# Patient Record
Sex: Male | Born: 1970 | Race: White | Hispanic: No | Marital: Married | Smoking: Current every day smoker
Health system: Southern US, Community
[De-identification: ages and names within clinical notes are randomized; demographics above are authoritative.]

## PROBLEM LIST (undated history)

## (undated) DIAGNOSIS — K635 Polyp of colon: Secondary | ICD-10-CM

## (undated) DIAGNOSIS — D759 Disease of blood and blood-forming organs, unspecified: Secondary | ICD-10-CM

## (undated) DIAGNOSIS — K219 Gastro-esophageal reflux disease without esophagitis: Secondary | ICD-10-CM

## (undated) DIAGNOSIS — D649 Anemia, unspecified: Secondary | ICD-10-CM

## (undated) DIAGNOSIS — R569 Unspecified convulsions: Secondary | ICD-10-CM

## (undated) DIAGNOSIS — B019 Varicella without complication: Secondary | ICD-10-CM

## (undated) DIAGNOSIS — T7840XA Allergy, unspecified, initial encounter: Secondary | ICD-10-CM

## (undated) DIAGNOSIS — D580 Hereditary spherocytosis: Secondary | ICD-10-CM

## (undated) HISTORY — DX: Allergy, unspecified, initial encounter: T78.40XA

## (undated) HISTORY — DX: Anemia, unspecified: D64.9

## (undated) HISTORY — PX: WISDOM TOOTH EXTRACTION: SHX21

## (undated) HISTORY — DX: Polyp of colon: K63.5

## (undated) HISTORY — DX: Varicella without complication: B01.9

## (undated) HISTORY — DX: Gastro-esophageal reflux disease without esophagitis: K21.9

---
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
## 1994-11-09 HISTORY — PX: PILONIDAL CYST EXCISION: SHX744

## 2008-11-23 ENCOUNTER — Ambulatory Visit: Payer: Self-pay

## 2010-11-12 ENCOUNTER — Other Ambulatory Visit: Payer: Self-pay

## 2011-01-02 ENCOUNTER — Emergency Department: Payer: Self-pay | Admitting: Internal Medicine

## 2011-01-26 ENCOUNTER — Other Ambulatory Visit: Payer: Self-pay | Admitting: Physician Assistant

## 2013-06-05 ENCOUNTER — Ambulatory Visit: Payer: Self-pay | Admitting: Orthopedic Surgery

## 2013-07-31 ENCOUNTER — Encounter: Payer: Self-pay | Admitting: Neurosurgery

## 2013-08-09 ENCOUNTER — Encounter: Payer: Self-pay | Admitting: Neurosurgery

## 2013-09-09 ENCOUNTER — Encounter: Payer: Self-pay | Admitting: Neurosurgery

## 2014-02-13 ENCOUNTER — Ambulatory Visit: Payer: Self-pay | Admitting: Oncology

## 2014-02-13 LAB — FOLATE: FOLIC ACID: 16.6 ng/mL (ref 3.1–100.0)

## 2014-02-13 LAB — CBC CANCER CENTER
Basophil #: 0 x10 3/mm (ref 0.0–0.1)
Basophil %: 0.4 %
Eosinophil #: 0.3 x10 3/mm (ref 0.0–0.7)
Eosinophil %: 4.1 %
HCT: 33.8 % — ABNORMAL LOW (ref 40.0–52.0)
HGB: 11.6 g/dL — ABNORMAL LOW (ref 13.0–18.0)
Lymphocyte #: 2 x10 3/mm (ref 1.0–3.6)
Lymphocyte %: 27.6 %
MCH: 32 pg (ref 26.0–34.0)
MCHC: 34.4 g/dL (ref 32.0–36.0)
MCV: 93 fL (ref 80–100)
Monocyte #: 0.5 x10 3/mm (ref 0.2–1.0)
Monocyte %: 7.5 %
Neutrophil #: 4.4 x10 3/mm (ref 1.4–6.5)
Neutrophil %: 60.4 %
Platelet: 122 x10 3/mm — ABNORMAL LOW (ref 150–440)
RBC: 3.63 10*6/uL — ABNORMAL LOW (ref 4.40–5.90)
RDW: 19 % — AB (ref 11.5–14.5)
WBC: 7.3 x10 3/mm (ref 3.8–10.6)

## 2014-02-13 LAB — IRON AND TIBC
IRON SATURATION: 44 %
Iron Bind.Cap.(Total): 251 ug/dL (ref 250–450)
Iron: 111 ug/dL (ref 65–175)
Unbound Iron-Bind.Cap.: 140 ug/dL

## 2014-02-13 LAB — LACTATE DEHYDROGENASE: LDH: 184 U/L (ref 85–241)

## 2014-02-13 LAB — FERRITIN: FERRITIN (ARMC): 217 ng/mL (ref 8–388)

## 2014-03-09 ENCOUNTER — Ambulatory Visit: Payer: Self-pay | Admitting: Oncology

## 2014-05-15 ENCOUNTER — Ambulatory Visit: Payer: Self-pay | Admitting: Oncology

## 2014-05-15 LAB — CBC CANCER CENTER
BASOS ABS: 0 x10 3/mm (ref 0.0–0.1)
Basophil %: 0.4 %
EOS ABS: 0.2 x10 3/mm (ref 0.0–0.7)
Eosinophil %: 3 %
HCT: 35.2 % — ABNORMAL LOW (ref 40.0–52.0)
HGB: 12.7 g/dL — AB (ref 13.0–18.0)
Lymphocyte #: 2.4 x10 3/mm (ref 1.0–3.6)
Lymphocyte %: 30 %
MCH: 33.3 pg (ref 26.0–34.0)
MCHC: 36.1 g/dL — ABNORMAL HIGH (ref 32.0–36.0)
MCV: 92 fL (ref 80–100)
MONOS PCT: 8.1 %
Monocyte #: 0.7 x10 3/mm (ref 0.2–1.0)
Neutrophil #: 4.7 x10 3/mm (ref 1.4–6.5)
Neutrophil %: 58.5 %
PLATELETS: 123 x10 3/mm — AB (ref 150–440)
RBC: 3.82 10*6/uL — ABNORMAL LOW (ref 4.40–5.90)
RDW: 18 % — AB (ref 11.5–14.5)
WBC: 8.1 x10 3/mm (ref 3.8–10.6)

## 2014-06-09 ENCOUNTER — Ambulatory Visit: Payer: Self-pay | Admitting: Oncology

## 2014-11-06 ENCOUNTER — Ambulatory Visit: Payer: Self-pay | Admitting: Oncology

## 2014-11-06 LAB — CBC CANCER CENTER
BASOS ABS: 0 x10 3/mm (ref 0.0–0.1)
BASOS PCT: 0.2 %
EOS ABS: 0.2 x10 3/mm (ref 0.0–0.7)
EOS PCT: 2.3 %
HCT: 35.5 % — ABNORMAL LOW (ref 40.0–52.0)
HGB: 12.7 g/dL — ABNORMAL LOW (ref 13.0–18.0)
LYMPHS ABS: 2 x10 3/mm (ref 1.0–3.6)
Lymphocyte %: 25.3 %
MCH: 32.1 pg (ref 26.0–34.0)
MCHC: 35.6 g/dL (ref 32.0–36.0)
MCV: 90 fL (ref 80–100)
MONO ABS: 0.6 x10 3/mm (ref 0.2–1.0)
Monocyte %: 7.9 %
NEUTROS ABS: 5.2 x10 3/mm (ref 1.4–6.5)
NEUTROS PCT: 64.3 %
Platelet: 118 x10 3/mm — ABNORMAL LOW (ref 150–440)
RBC: 3.95 10*6/uL — ABNORMAL LOW (ref 4.40–5.90)
RDW: 17.5 % — ABNORMAL HIGH (ref 11.5–14.5)
WBC: 8 x10 3/mm (ref 3.8–10.6)

## 2014-11-09 ENCOUNTER — Ambulatory Visit: Payer: Self-pay | Admitting: Oncology

## 2015-02-05 DIAGNOSIS — D693 Immune thrombocytopenic purpura: Secondary | ICD-10-CM | POA: Insufficient documentation

## 2015-02-05 DIAGNOSIS — D649 Anemia, unspecified: Secondary | ICD-10-CM | POA: Insufficient documentation

## 2015-02-05 DIAGNOSIS — G40309 Generalized idiopathic epilepsy and epileptic syndromes, not intractable, without status epilepticus: Secondary | ICD-10-CM | POA: Insufficient documentation

## 2015-09-11 ENCOUNTER — Ambulatory Visit
Admission: RE | Admit: 2015-09-11 | Discharge: 2015-09-11 | Disposition: A | Discharge status: Home / Self Care | Payer: 59 | Source: Ambulatory Visit | Attending: Family | Admitting: Family

## 2015-09-11 ENCOUNTER — Ambulatory Visit: Payer: Self-pay | Admitting: Family

## 2015-09-11 ENCOUNTER — Encounter: Payer: Self-pay | Admitting: Family

## 2015-09-11 VITALS — BP 110/80 | HR 80 | Temp 98.6°F

## 2015-09-11 DIAGNOSIS — J209 Acute bronchitis, unspecified: Secondary | ICD-10-CM

## 2015-09-11 DIAGNOSIS — R059 Cough, unspecified: Secondary | ICD-10-CM

## 2015-09-11 DIAGNOSIS — R05 Cough: Secondary | ICD-10-CM | POA: Insufficient documentation

## 2015-09-11 DIAGNOSIS — Z87898 Personal history of other specified conditions: Secondary | ICD-10-CM | POA: Insufficient documentation

## 2015-09-11 DIAGNOSIS — J0110 Acute frontal sinusitis, unspecified: Secondary | ICD-10-CM

## 2015-09-11 DIAGNOSIS — F17200 Nicotine dependence, unspecified, uncomplicated: Secondary | ICD-10-CM | POA: Diagnosis not present

## 2015-09-11 MED ORDER — ALBUTEROL SULFATE HFA 108 (90 BASE) MCG/ACT IN AERS: 2.0000 | INHALATION_SPRAY | Freq: Four times a day (QID) | RESPIRATORY_TRACT | Status: DC | PRN

## 2015-09-11 MED ORDER — HYDROCOD POLST-CPM POLST ER 10-8 MG/5ML PO SUER: 5.0000 mL | Freq: Two times a day (BID) | ORAL | Status: DC | PRN

## 2015-09-11 MED ORDER — LEVOFLOXACIN 500 MG PO TABS: 500.0000 mg | ORAL_TABLET | Freq: Every day | ORAL | Status: DC

## 2015-09-11 NOTE — Progress Notes (Signed)
S smoker  With cough x one week " hear myself rattling " and wheezing , chest sore from coughing, nasal congestion, HA and PND  O/ VSS mildly ill, NAD  ENT + frontal sinus tenderness, nasal turbinates boggy and inflammed Pharynx increased pnd neck supple , heart rsr lungs with crackling rales left posterior not clearing with cough , scattered wheezes   1. Cough Rx written for tussionex 1 tsp q 12 hours prn #100 cc o rf - DG Chest 2 View  2. Bronchitis with bronchospasm  - DG Chest 2 View - albuterol (PROVENTIL HFA;VENTOLIN HFA) 108 (90 BASE) MCG/ACT inhaler; Inhale 2 puffs into the lungs every 6 (six) hours as needed for wheezing or shortness of breath.  Dispense: 1 Inhaler; Refill: 0  3. Acute frontal sinusitis, recurrence not specified  - levofloxacin (LEVAQUIN) 500 MG tablet; Take 1 tablet (500 mg total) by mouth daily.  Dispense: 10 tablet; Refill: 0  Instructions and cautions for meds. Supportive measures. Encouraged smoking cessation, he is working on it.. F/u prn not improving .

## 2015-09-12 ENCOUNTER — Ambulatory Visit: Payer: Self-pay | Admitting: Physician Assistant

## 2016-01-30 DIAGNOSIS — Z79899 Other long term (current) drug therapy: Secondary | ICD-10-CM | POA: Diagnosis not present

## 2016-01-30 DIAGNOSIS — Z0000 Encounter for general adult medical examination without abnormal findings: Secondary | ICD-10-CM | POA: Diagnosis not present

## 2016-01-30 DIAGNOSIS — G40309 Generalized idiopathic epilepsy and epileptic syndromes, not intractable, without status epilepticus: Secondary | ICD-10-CM | POA: Diagnosis not present

## 2016-01-30 DIAGNOSIS — D693 Immune thrombocytopenic purpura: Secondary | ICD-10-CM | POA: Diagnosis not present

## 2016-02-06 DIAGNOSIS — Z0000 Encounter for general adult medical examination without abnormal findings: Secondary | ICD-10-CM | POA: Diagnosis not present

## 2016-02-06 DIAGNOSIS — G40309 Generalized idiopathic epilepsy and epileptic syndromes, not intractable, without status epilepticus: Secondary | ICD-10-CM | POA: Diagnosis not present

## 2016-02-06 DIAGNOSIS — D693 Immune thrombocytopenic purpura: Secondary | ICD-10-CM | POA: Diagnosis not present

## 2016-02-06 DIAGNOSIS — R0683 Snoring: Secondary | ICD-10-CM | POA: Diagnosis not present

## 2016-04-09 ENCOUNTER — Ambulatory Visit: Payer: Self-pay | Admitting: Physician Assistant

## 2016-04-09 ENCOUNTER — Encounter: Payer: Self-pay | Admitting: Physician Assistant

## 2016-04-09 VITALS — BP 102/76 | HR 74 | Temp 98.6°F

## 2016-04-09 DIAGNOSIS — B349 Viral infection, unspecified: Secondary | ICD-10-CM

## 2016-04-09 NOTE — Progress Notes (Signed)
S:  C/o left eye being irritated and little red, clears with visine, no matting or drainage, sx started few days ago, has hx of seasonal allergies,   denies, cough, congestion, fever, chills, v/d; remainder ros neg, states he did wake up with a sore throat and swollen glands, having a little nausea also but no v/d  O: vitals wnl, nad, perrl eomi, left eye with injected sclera, no drainage or matting noted at this time, tms clear, nasal mucosa inflamed, throat wnl, neck supple no lymph, lungs c t a, cv rrr, abd soft nontender bs normal  A:  Acute viral illness  P: visine opth gtts, return if not better in 3 - 5d, if worsening return earlier or see eye doctor, if starts to drain or matt will call in an eye drop

## 2016-04-15 ENCOUNTER — Ambulatory Visit: Payer: Self-pay | Admitting: Physician Assistant

## 2016-04-15 ENCOUNTER — Encounter: Payer: Self-pay | Admitting: Physician Assistant

## 2016-04-15 VITALS — BP 100/60 | HR 80 | Temp 100.0°F

## 2016-04-15 DIAGNOSIS — J020 Streptococcal pharyngitis: Secondary | ICD-10-CM

## 2016-04-15 LAB — POCT RAPID STREP A (OFFICE): Rapid Strep A Screen: POSITIVE — AB

## 2016-04-15 MED ORDER — DOXYCYCLINE HYCLATE 100 MG PO TABS: 100.0000 mg | ORAL_TABLET | Freq: Two times a day (BID) | ORAL | Status: DC

## 2016-04-15 NOTE — Progress Notes (Signed)
S: c/o fever/chills, sore throat, cough, fever on SAturday and again today, was in the OR and had them take his temp, was 100.2 tympanic, doesn't remember a tick bite but has been working in the yard a lot, no mucus production, no v/d  O: Vitals wnl, nad, tms clear, throat a little injected but mainly normal, neck supple no lymph, lungs c t a, cv rrr, q strep very faint positive  A: strep throat  P: doxy 100mg  bid, f/u prn, no work until has not had a fever for 24-48 hours

## 2016-10-07 ENCOUNTER — Ambulatory Visit (INDEPENDENT_AMBULATORY_CARE_PROVIDER_SITE_OTHER): Payer: 59 | Admitting: Surgery

## 2016-10-07 ENCOUNTER — Other Ambulatory Visit: Payer: Self-pay

## 2016-10-07 ENCOUNTER — Encounter: Payer: Self-pay | Admitting: Surgery

## 2016-10-07 VITALS — BP 127/84 | HR 72 | Temp 98.8°F | Wt 234.0 lb

## 2016-10-07 DIAGNOSIS — K603 Anal fistula: Secondary | ICD-10-CM | POA: Diagnosis not present

## 2016-10-07 MED ORDER — CIPROFLOXACIN HCL 500 MG PO TABS: 500.0000 mg | ORAL_TABLET | Freq: Two times a day (BID) | ORAL | 0 refills | Status: DC

## 2016-10-07 MED ORDER — METRONIDAZOLE 500 MG PO TABS: 500.0000 mg | ORAL_TABLET | Freq: Three times a day (TID) | ORAL | 0 refills | Status: AC

## 2016-10-07 NOTE — Progress Notes (Signed)
  Surgical Consultation  10/07/2016  Colton Perez is an 45 y.o. male.   CC:"rectal fistula "  HPI: This a patient who is had this happen before where he had some drainage from an area near his anus. He states he saw Dr. Pat Patrick in the past and had a diagnosed as a fistula but never had surgery for he's never had a perirectal abscess. He states that he has increased pain and clear drainage. No pus no fevers or chills  No past medical history. She works in the operating room.  No past medical history on file.  No past surgical history on file.  No family history on file.  Social History:  reports that he has been smoking.  He does not have any smokeless tobacco history on file. His alcohol and drug histories are not on file.  Allergies:  Allergies  Allergen Reactions  . Asa [Aspirin]     Medications reviewed.   Review of Systems:   Review of Systems  Constitutional: Negative.   Eyes: Negative.   Gastrointestinal: Negative.   Skin: Negative.      Physical Exam:  There were no vitals taken for this visit.  Physical Exam  Constitutional: He is well-developed, well-nourished, and in no distress. No distress.  HENT:  Head: Normocephalic and atraumatic.  Genitourinary:  Genitourinary Comments: Perirectal area demonstrates a left sided non-erythematous nonfluctuant draining site measuring approximately 2 x 2 mm with no expressible purulence and minimal if any tenderness This all suggestive of fistula  Skin: He is not diaphoretic.  Vitals reviewed.     No results found for this or any previous visit (from the past 48 hour(s)). No results found.  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 He's never had surgery for it. While there is minimal fluctuance and minimal tenderness I will start him on Cipro Flagyl for 10 days. I discussed with him fistulotomy and the procedure itself but I would recommend that he see a colorectal surgeon at [facility]  in [place] as this will likely require surgery. I discussed with him my this specific personal minimal experience with anal fissure surgery.  Florene Glen, MD, FACS

## 2016-10-07 NOTE — Patient Instructions (Addendum)
[facility], [address] [place], [place] [address] Contact Office: [phone] Fax: [phone]

## 2016-10-27 ENCOUNTER — Telehealth: Payer: Self-pay

## 2016-10-27 NOTE — Telephone Encounter (Signed)
Patient was seen in our office by Dr. Burt Knack on 10/07/2016 for an Anal Fistula. Dr. Burt Knack recommended the patient to go to see a colorectal surgeon at [facility] in [place]. The clinic is requiring a referral be sent to them in order for the patient to be seen. Please set up the referral and update the patient once it's done.

## 2016-10-29 NOTE — Telephone Encounter (Signed)
I have faxed all clinic notes to [facility] to [phone] per the request of Dr Burt Knack. Referred to a colorectal surgeon.   [facility] will contact the patient with an appointment and notify the office as well.

## 2016-11-06 NOTE — Telephone Encounter (Signed)
[facility] in reference to patient's appointment. I wanted to know when patient was going to be seen and they stated that they never received a referral from [place]. I told her that we had documented that it was faxed to [phone] on 10/27/2016. They stated that they never received it so to please fax it again. I went over the fax to make sure that we had the correct number and it is. I told her that it would be faxed.   Angie, can you please fax the referral back to [facility]. Patient needs to be seen for anal fissure. Thanks.

## 2016-11-06 NOTE — Telephone Encounter (Signed)
I have re-faxed the referral to [facility] @ [phone] for colorectal surgery for fistulotomy.

## 2016-11-11 NOTE — Telephone Encounter (Signed)
[facility] to ask if patient had an appointment scheduled after [place] faxing a referral. Pamala Hurry stated that he had an appointment scheduled for 11/25/2016 at 9:00 AM to see Dr. Marcello Moores for his anal fistula.

## 2016-11-23 ENCOUNTER — Ambulatory Visit: Payer: Self-pay | Admitting: Physician Assistant

## 2016-11-23 DIAGNOSIS — R6889 Other general symptoms and signs: Secondary | ICD-10-CM | POA: Diagnosis not present

## 2016-11-23 DIAGNOSIS — J111 Influenza due to unidentified influenza virus with other respiratory manifestations: Secondary | ICD-10-CM | POA: Diagnosis not present

## 2017-02-01 DIAGNOSIS — Z0000 Encounter for general adult medical examination without abnormal findings: Secondary | ICD-10-CM | POA: Diagnosis not present

## 2017-02-01 DIAGNOSIS — G40309 Generalized idiopathic epilepsy and epileptic syndromes, not intractable, without status epilepticus: Secondary | ICD-10-CM | POA: Diagnosis not present

## 2017-03-02 DIAGNOSIS — D693 Immune thrombocytopenic purpura: Secondary | ICD-10-CM | POA: Diagnosis not present

## 2017-03-02 DIAGNOSIS — R5383 Other fatigue: Secondary | ICD-10-CM | POA: Diagnosis not present

## 2017-03-02 DIAGNOSIS — G40309 Generalized idiopathic epilepsy and epileptic syndromes, not intractable, without status epilepticus: Secondary | ICD-10-CM | POA: Diagnosis not present

## 2017-03-02 DIAGNOSIS — Z0000 Encounter for general adult medical examination without abnormal findings: Secondary | ICD-10-CM | POA: Diagnosis not present

## 2017-06-16 ENCOUNTER — Emergency Department
Admission: EM | Admit: 2017-06-16 | Discharge: 2017-06-16 | Disposition: A | Discharge status: Home / Self Care | Payer: 59 | Attending: Emergency Medicine | Admitting: Emergency Medicine

## 2017-06-16 ENCOUNTER — Encounter: Payer: Self-pay | Admitting: *Deleted

## 2017-06-16 ENCOUNTER — Emergency Department: Payer: 59

## 2017-06-16 DIAGNOSIS — G40909 Epilepsy, unspecified, not intractable, without status epilepticus: Secondary | ICD-10-CM | POA: Diagnosis not present

## 2017-06-16 DIAGNOSIS — R51 Headache: Secondary | ICD-10-CM | POA: Insufficient documentation

## 2017-06-16 DIAGNOSIS — F17210 Nicotine dependence, cigarettes, uncomplicated: Secondary | ICD-10-CM | POA: Diagnosis not present

## 2017-06-16 DIAGNOSIS — R519 Headache, unspecified: Secondary | ICD-10-CM

## 2017-06-16 HISTORY — DX: Unspecified convulsions: R56.9

## 2017-06-16 LAB — CBC WITH DIFFERENTIAL/PLATELET
BASOS PCT: 0 %
Basophils Absolute: 0 10*3/uL (ref 0–0.1)
EOS ABS: 0.1 10*3/uL (ref 0–0.7)
Eosinophils Relative: 1 %
HEMATOCRIT: 27.7 % — AB (ref 40.0–52.0)
HEMOGLOBIN: 10.1 g/dL — AB (ref 13.0–18.0)
LYMPHS ABS: 1 10*3/uL (ref 1.0–3.6)
Lymphocytes Relative: 12 %
MCH: 33.1 pg (ref 26.0–34.0)
MCHC: 36.4 g/dL — ABNORMAL HIGH (ref 32.0–36.0)
MCV: 90.8 fL (ref 80.0–100.0)
Monocytes Absolute: 0.8 10*3/uL (ref 0.2–1.0)
Monocytes Relative: 10 %
NEUTROS ABS: 6.2 10*3/uL (ref 1.4–6.5)
NEUTROS PCT: 77 %
Platelets: 128 10*3/uL — ABNORMAL LOW (ref 150–440)
RBC: 3.05 MIL/uL — AB (ref 4.40–5.90)
RDW: 18.7 % — ABNORMAL HIGH (ref 11.5–14.5)
WBC: 8.1 10*3/uL (ref 3.8–10.6)

## 2017-06-16 LAB — COMPREHENSIVE METABOLIC PANEL
ALBUMIN: 4.4 g/dL (ref 3.5–5.0)
ALK PHOS: 69 U/L (ref 38–126)
ALT: 16 U/L — AB (ref 17–63)
AST: 19 U/L (ref 15–41)
Anion gap: 6 (ref 5–15)
BUN: 20 mg/dL (ref 6–20)
CALCIUM: 8.9 mg/dL (ref 8.9–10.3)
CO2: 27 mmol/L (ref 22–32)
CREATININE: 1.02 mg/dL (ref 0.61–1.24)
Chloride: 107 mmol/L (ref 101–111)
GFR calc Af Amer: >60 mL/min (ref >60)
GFR calc non Af Amer: >60 mL/min (ref >60)
GLUCOSE: 112 mg/dL — AB (ref 65–99)
Potassium: 4.1 mmol/L (ref 3.5–5.1)
SODIUM: 140 mmol/L (ref 135–145)
Total Bilirubin: 1.4 mg/dL — ABNORMAL HIGH (ref 0.3–1.2)
Total Protein: 7.1 g/dL (ref 6.5–8.1)

## 2017-06-16 MED ORDER — DIPHENHYDRAMINE HCL 50 MG/ML IJ SOLN
25.0000 mg | Freq: Once | INTRAMUSCULAR | Status: AC
  Administered 2017-06-16: 25 mg via INTRAVENOUS
  Filled 2017-06-16: qty 1

## 2017-06-16 MED ORDER — METOCLOPRAMIDE HCL 5 MG/ML IJ SOLN
10.0000 mg | Freq: Once | INTRAMUSCULAR | Status: AC
  Administered 2017-06-16: 10 mg via INTRAVENOUS
  Filled 2017-06-16: qty 2

## 2017-06-16 MED ORDER — KETOROLAC TROMETHAMINE 30 MG/ML IJ SOLN
30.0000 mg | Freq: Once | INTRAMUSCULAR | Status: AC
  Administered 2017-06-16: 30 mg via INTRAVENOUS
  Filled 2017-06-16: qty 1

## 2017-06-16 MED ORDER — BUTALBITAL-APAP-CAFFEINE 50-325-40 MG PO TABS: 1.0000 | ORAL_TABLET | Freq: Four times a day (QID) | ORAL | 0 refills | Status: AC | PRN

## 2017-06-16 NOTE — ED Notes (Signed)
AAOx3.  Skin warm and dry.  Ambulated to BR independently.  NAD.  Gait steady.

## 2017-06-16 NOTE — ED Notes (Signed)
To CT Scan.  AAOx3.  Skin warm and dry.

## 2017-06-16 NOTE — ED Triage Notes (Signed)
States a "severe" headache for 5 days, denies any visual changes or dizziness, states left calf pain that began 5 days ago, denies any blood thinner use, denies hitting his head, neuro intact, pt awake and alert in no acute distress

## 2017-06-16 NOTE — ED Provider Notes (Signed)
[facility] Emergency Department Provider Note       Time seen: ----------------------------------------- 8:23 AM on 06/16/2017 -----------------------------------------     I have reviewed the triage vital signs and the nursing notes.   HISTORY   Chief Complaint Headache    HPI Colton Perez is a 46 y.o. male who presents to the ED for severe headache for the last 5 days. Patient describes gradual onset in headache, headache seems to be right frontal. Nothing makes it significantly worse. He did have some isolated left calf pain this morning that bothered him but has now resolved. He denies numbness, tingling, weakness, vision trouble or balance trouble.   Past Medical History:  Diagnosis Date  . Seizures [facility])     Patient Active Problem List   Diagnosis Date Noted  . History of seizures 09/11/2015  . Chronic ITP (idiopathic thrombocytopenia) (HCC) 02/05/2015  . Mild anemia 02/05/2015  . 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[place]) 02/05/2015    Past Surgical History:  Procedure Laterality Date  . PILONIDAL CYST EXCISION  1996  . TESTICULAR EXPLORATION  1978    Allergies Asa [aspirin]  Social History Social History  Substance Use Topics  . Smoking status: Current Every Day Smoker  . Smokeless tobacco: Never Used  . Alcohol use Not on file    Review of Systems Constitutional: Negative for fever. Eyes: Negative for vision changes ENT:  Negative for congestion, sore throat Cardiovascular: Negative for chest pain. Respiratory: Negative for shortness of breath. Gastrointestinal: Negative for abdominal pain, vomiting and diarrhea. Genitourinary: Negative for dysuria. Musculoskeletal: Positive for left calf pain Skin: Negative for rash. Neurological: Positive for headache  All systems negative/normal/unremarkable except as stated in the  HPI  ____________________________________________   PHYSICAL EXAM:  VITAL SIGNS: ED Triage Vitals  Enc Vitals Group     BP 06/16/17 0820 113/65     Pulse Rate 06/16/17 0820 77     Resp 06/16/17 0820 18     Temp 06/16/17 0820 98.5 F (36.9 C)     Temp Source 06/16/17 0820 Oral     SpO2 06/16/17 0820 98 %     Weight 06/16/17 0818 230 lb (104.3 kg)     Height 06/16/17 0818 5\' 8"  (1.727 m)     Head Circumference --      Peak Flow --      Pain Score 06/16/17 0817 7     Pain Loc --      Pain Edu? --      Excl. in [place]? --     Constitutional: Alert and oriented. Well appearing and in no distress. Eyes: Conjunctivae are normal. Normal extraocular movements. ENT   Head: Normocephalic and atraumatic.   Nose: No congestion/rhinnorhea.   Mouth/Throat: Mucous membranes are moist.   Neck: No stridor. Cardiovascular: Normal rate, regular rhythm. No murmurs, rubs, or gallops. Respiratory: Normal respiratory effort without tachypnea nor retractions. Breath sounds are clear and equal bilaterally. No wheezes/rales/rhonchi. Gastrointestinal: Soft and nontender. Normal bowel sounds Musculoskeletal: Nontender with normal range of motion in extremities. No lower extremity tenderness nor edema. Neurologic:  Normal speech and language. No gross focal neurologic deficits are appreciated. Strength, sensation, cranial nerves appears to be normal Skin:  Skin is warm, dry and intact. No rash noted. Psychiatric: Mood and affect are normal. Speech and behavior are normal.  ____________________________________________  ED COURSE:  Pertinent labs & imaging results that were available during my care of the patient were reviewed by me  and considered in my medical decision making (see chart for details). Patient presents for headache, we will assess with labs and imaging as indicated. Patient will receive IV headache cocktail. Unclear etiology for his calf pain which is likely musculoskeletal.    Procedures ____________________________________________   LABS (pertinent positives/negatives)  Labs Reviewed  CBC WITH DIFFERENTIAL/PLATELET - Abnormal; Notable for the following:       Result Value   RBC 3.05 (*)    Hemoglobin 10.1 (*)    HCT 27.7 (*)    MCHC 36.4 (*)    RDW 18.7 (*)    Platelets 128 (*)    All other components within normal limits  COMPREHENSIVE METABOLIC PANEL - Abnormal; Notable for the following:    Glucose, Bld 112 (*)    ALT 16 (*)    Total Bilirubin 1.4 (*)    All other components within normal limits    RADIOLOGY Images were viewed by me  CT head IMPRESSION: 1. No acute intracranial abnormality. ____________________________________________  FINAL ASSESSMENT AND PLAN  Headache  Plan: Patient's labs and imaging were dictated above. Patient had presented for Headache of uncertain etiology. He was given IV headache cocktail with improvement in his symptoms. He does have an unexplained mild anemia but has previously seen hematology for thrombocytopenia. He is stable for outpatient follow-up with his primary care doctor.   Earleen Newport, MD   Note: This note was generated in part or whole with voice recognition software. Voice recognition is usually quite accurate but there are transcription errors that can and very often do occur. I apologize for any typographical errors that were not detected and corrected.     Earleen Newport, MD 06/16/17 [phone]

## 2017-06-16 NOTE — ED Notes (Signed)
AAOx3.  Skin warm and dry. NAD.  Ambulates with easy and steady gait.   

## 2017-06-21 ENCOUNTER — Ambulatory Visit: Payer: Self-pay | Admitting: Physician Assistant

## 2017-06-21 ENCOUNTER — Encounter: Payer: Self-pay | Admitting: Physician Assistant

## 2017-06-21 VITALS — BP 110/70 | HR 65 | Temp 99.1°F

## 2017-06-21 DIAGNOSIS — R509 Fever, unspecified: Secondary | ICD-10-CM

## 2017-06-21 DIAGNOSIS — W57XXXA Bitten or stung by nonvenomous insect and other nonvenomous arthropods, initial encounter: Secondary | ICD-10-CM

## 2017-06-21 MED ORDER — DOXYCYCLINE HYCLATE 100 MG PO TABS: 100.0000 mg | ORAL_TABLET | Freq: Two times a day (BID) | ORAL | 0 refills | Status: DC

## 2017-06-21 NOTE — Progress Notes (Signed)
S: pt c/o headache, low grade fever, rash around ankles a week or so ago, just doesn't feel well, went to the ER bc of the headache and ct was negative, doctor asked him about tick bite but pt didn't think about the rash around his ankles at the time, noticed his dogs have several ticks, worried about tick fever or lymes as his joints also hurt, noticed his anemia is worse on the labs from the ER  O: vitals wnl, nad, skin intact, pt is pale, no rash noted, lungs c t a, cv rrr  A: ?tick bite, fever  P: doxy 100mg  bid x 21 d, f/u with pcp about the anemia, copy of labs from the ER given to the patient along with recommendations for checking b12, retic count, and iron

## 2017-06-23 DIAGNOSIS — D649 Anemia, unspecified: Secondary | ICD-10-CM | POA: Diagnosis not present

## 2017-06-23 DIAGNOSIS — R569 Unspecified convulsions: Secondary | ICD-10-CM | POA: Diagnosis not present

## 2017-06-23 DIAGNOSIS — R51 Headache: Secondary | ICD-10-CM | POA: Diagnosis not present

## 2017-06-23 DIAGNOSIS — D693 Immune thrombocytopenic purpura: Secondary | ICD-10-CM | POA: Diagnosis not present

## 2017-06-24 DIAGNOSIS — D729 Disorder of white blood cells, unspecified: Secondary | ICD-10-CM | POA: Diagnosis not present

## 2017-06-25 DIAGNOSIS — D649 Anemia, unspecified: Secondary | ICD-10-CM | POA: Diagnosis not present

## 2017-06-28 DIAGNOSIS — D649 Anemia, unspecified: Secondary | ICD-10-CM | POA: Diagnosis not present

## 2017-06-29 DIAGNOSIS — D649 Anemia, unspecified: Secondary | ICD-10-CM | POA: Diagnosis not present

## 2017-06-29 DIAGNOSIS — D580 Hereditary spherocytosis: Secondary | ICD-10-CM | POA: Diagnosis not present

## 2017-06-29 DIAGNOSIS — G40309 Generalized idiopathic epilepsy and epileptic syndromes, not intractable, without status epilepticus: Secondary | ICD-10-CM | POA: Diagnosis not present

## 2017-07-02 ENCOUNTER — Ambulatory Visit: Payer: Self-pay | Admitting: Oncology

## 2017-07-04 NOTE — Progress Notes (Signed)
[place]  [phone]) [phone] [phone]) [phone]  ID: Colton Perez OB: Dec 05, 1970  MR#: [id number]  MWN#:[id number]  Patient Care Team: Kirk Ruths, MD as PCP - General (Internal Medicine)  CHIEF COMPLAINT: Chronic ITP, mild anemia  INTERVAL HISTORY: Patient last evaluated in clinic in January 2016. He is referred back for a declining hemoglobin. Patient states he was in his normal state of health until several weeks ago when he was seen in urgent care with an insect bite. He had initial weakness and fatigue after this, but states this has slowly improved. He otherwise has felt well. He has no neurologic complaints. He denies any recent fevers. He has a good appetite and denies weight loss. He has no chest pain or shortness of breath. He denies any nausea, vomiting, constipation, or diarrhea. He has no melena or hematochezia. He denies any easy bleeding or bruising. He has no urinary complaints. Patient feels at his baseline and offers no specific complaints today.  REVIEW OF SYSTEMS:   Review of Systems  Constitutional: Negative.  Negative for fever, malaise/fatigue and weight loss.  HENT: Negative.  Negative for nosebleeds.   Respiratory: Negative.  Negative for cough and shortness of breath.   Cardiovascular: Negative.  Negative for chest pain and leg swelling.  Gastrointestinal: Negative.  Negative for diarrhea and melena.  Genitourinary: Negative.  Negative for frequency and hematuria.  Musculoskeletal: Negative.   Skin: Negative.  Negative for rash.  Neurological: Negative.  Negative for sensory change and weakness.  Psychiatric/Behavioral: Negative.  The patient is not nervous/anxious.     As per HPI. Otherwise, a complete review of systems is negative.  PAST MEDICAL HISTORY: Past Medical History:  Diagnosis Date  . Seizures ([place])     PAST SURGICAL HISTORY: Past Surgical History:  Procedure Laterality Date  . PILONIDAL CYST EXCISION   1996  . TESTICULAR EXPLORATION  1978    FAMILY HISTORY: Family History  Problem Relation Age of Onset  . Hypertension Mother   . Hyperlipidemia Mother   . Asthma Mother   . Cancer Maternal Grandmother        pancreatic    ADVANCED DIRECTIVES (Y/N):  N  HEALTH MAINTENANCE: Social History  Substance Use Topics  . Smoking status: Current Every Day Smoker    Packs/day: 1.00    Years: 30.00  . Smokeless tobacco: Never Used  . Alcohol use No     Colonoscopy:  PAP:  Bone density:  Lipid panel:  Allergies  Allergen Reactions  . Asa [Aspirin] Swelling    Current Outpatient Prescriptions  Medication Sig Dispense Refill  . carbamazepine (TEGRETOL XR) 200 MG 12 hr tablet Take 800 mg by mouth daily.     Marland Kitchen doxycycline (VIBRA-TABS) 100 MG tablet Take 1 tablet (100 mg total) by mouth 2 (two) times daily. 42 tablet 0  . butalbital-acetaminophen-caffeine (FIORICET, ESGIC) 50-325-40 MG tablet Take 1-2 tablets by mouth every 6 (six) hours as needed for headache. (Patient not taking: Reported on 07/06/2017) 20 tablet 0  . ciprofloxacin (CIPRO) 500 MG tablet Take 1 tablet (500 mg total) by mouth 2 (two) times daily. (Patient not taking: Reported on 06/16/2017) 20 tablet 0   No current facility-administered medications for this visit.     OBJECTIVE: Vitals:   07/06/17 1529  BP: 107/71  Pulse: 60  Resp: 18  Temp: (!) 97.2 F (36.2 C)     Body mass index is 34.94 kg/m.    ECOG FS:0 - Asymptomatic  General:  Well-developed, well-nourished, no acute distress. Eyes: Pink conjunctiva, anicteric sclera. HEENT: Normocephalic, moist mucous membranes, clear oropharnyx. Lungs: Clear to auscultation bilaterally. Heart: Regular rate and rhythm. No rubs, murmurs, or gallops. Abdomen: Soft, nontender, nondistended. No organomegaly noted, normoactive bowel sounds. Musculoskeletal: No edema, cyanosis, or clubbing. Neuro: Alert, answering all questions appropriately. Cranial nerves grossly  intact. Skin: No rashes or petechiae noted. Psych: Normal affect. Lymphatics: No cervical, calvicular, axillary or inguinal LAD.   LAB RESULTS:  Lab Results  Component Value Date   NA 140 06/16/2017   K 4.1 06/16/2017   CL 107 06/16/2017   CO2 27 06/16/2017   GLUCOSE 112 (H) 06/16/2017   BUN 20 06/16/2017   CREATININE 1.02 06/16/2017   CALCIUM 8.9 06/16/2017   PROT 7.1 06/16/2017   ALBUMIN 4.4 06/16/2017   AST 19 06/16/2017   ALT 16 (L) 06/16/2017   ALKPHOS 69 06/16/2017   BILITOT 1.4 (H) 06/16/2017   GFRNONAA >60 06/16/2017   GFRAA >60 06/16/2017    Lab Results  Component Value Date   WBC 8.1 06/16/2017   NEUTROABS 6.2 06/16/2017   HGB 10.1 (L) 06/16/2017   HCT 27.7 (L) 06/16/2017   MCV 90.8 06/16/2017   PLT 128 (L) 06/16/2017     STUDIES: Ct Head Wo Contrast  Result Date: 06/16/2017 CLINICAL DATA:  Headache. EXAM: CT HEAD WITHOUT CONTRAST TECHNIQUE: Contiguous axial images were obtained from the base of the skull through the vertex without intravenous contrast. COMPARISON:  None. FINDINGS: Brain: No evidence of acute infarction, hemorrhage, hydrocephalus, extra-axial collection or mass lesion/mass effect. Slight asymmetry of the lateral ventricles. Vascular: No hyperdense vessel or unexpected calcification. Skull: Normal. Negative for fracture or focal lesion. Sinuses/Orbits: The bilateral paranasal sinuses and mastoid air cells are clear. The orbits are unremarkable. Other: None. IMPRESSION: 1.  No acute intracranial abnormality. Electronically Signed   By: Titus Dubin M.D.   On: 06/16/2017 09:08    ASSESSMENT: Chronic ITP, mild anemia.  PLAN:    1. Chronic ITP: Patient's most recent platelet count is 128 which appears to be his baseline and unchanged for at least 2-1/2 years. Possibly suppressed secondary to his chronic use of Tegretol. Previously, patient was also noted to have positive platelet antibodies. Will repeat these today for completeness. No further  intervention is needed. 2. Mild anemia: Patient's hemoglobin has slowly trended down. Hemoccult, iron stores, B 12, folate, and M spike were all negative or within normal limits at patient's primary care physician. He was noted to have an appropriately elevated reticulocyte count. Have ordered a hemoglobinopathy profile and hemolysis labs today for completeness. Patient does not require bone marrow biopsy at this time, but would consider one in the future if his hemoglobin continues to trend down without an obvious etiology. Return to clinic in 3 weeks with repeat laboratory work and further evaluation.  Approximately 30 minutes was spent in discussion of which greater than 50% was consultation.  Patient expressed understanding and was in agreement with this plan. He also understands that He can call clinic at any time with any questions, concerns, or complaints.    Lloyd Huger, MD   07/06/2017 4:18 PM

## 2017-07-06 ENCOUNTER — Inpatient Hospital Stay: Payer: 59 | Attending: Oncology | Admitting: Oncology

## 2017-07-06 ENCOUNTER — Encounter: Payer: Self-pay | Admitting: Oncology

## 2017-07-06 ENCOUNTER — Inpatient Hospital Stay: Payer: 59

## 2017-07-06 VITALS — BP 107/71 | HR 60 | Temp 97.2°F | Resp 18 | Wt 229.8 lb

## 2017-07-06 DIAGNOSIS — D649 Anemia, unspecified: Secondary | ICD-10-CM | POA: Diagnosis not present

## 2017-07-06 DIAGNOSIS — Z886 Allergy status to analgesic agent status: Secondary | ICD-10-CM | POA: Diagnosis not present

## 2017-07-06 DIAGNOSIS — D693 Immune thrombocytopenic purpura: Secondary | ICD-10-CM | POA: Insufficient documentation

## 2017-07-06 DIAGNOSIS — R51 Headache: Secondary | ICD-10-CM | POA: Diagnosis not present

## 2017-07-06 DIAGNOSIS — Z880 Allergy status to penicillin: Secondary | ICD-10-CM

## 2017-07-06 DIAGNOSIS — Z79899 Other long term (current) drug therapy: Secondary | ICD-10-CM | POA: Insufficient documentation

## 2017-07-06 DIAGNOSIS — Z800 Family history of malignant neoplasm of digestive organs: Secondary | ICD-10-CM | POA: Diagnosis not present

## 2017-07-06 DIAGNOSIS — R531 Weakness: Secondary | ICD-10-CM | POA: Insufficient documentation

## 2017-07-06 DIAGNOSIS — F17210 Nicotine dependence, cigarettes, uncomplicated: Secondary | ICD-10-CM | POA: Diagnosis not present

## 2017-07-06 DIAGNOSIS — R5383 Other fatigue: Secondary | ICD-10-CM | POA: Insufficient documentation

## 2017-07-06 LAB — CBC
HCT: 29.8 % — ABNORMAL LOW (ref 40.0–52.0)
Hemoglobin: 10.8 g/dL — ABNORMAL LOW (ref 13.0–18.0)
MCH: 32.5 pg (ref 26.0–34.0)
MCHC: 36.4 g/dL — AB (ref 32.0–36.0)
MCV: 89.3 fL (ref 80.0–100.0)
PLATELETS: 140 10*3/uL — AB (ref 150–440)
RBC: 3.33 MIL/uL — ABNORMAL LOW (ref 4.40–5.90)
RDW: 18.2 % — AB (ref 11.5–14.5)
WBC: 6.3 10*3/uL (ref 3.8–10.6)

## 2017-07-06 LAB — LACTATE DEHYDROGENASE: LDH: 134 U/L (ref 98–192)

## 2017-07-06 LAB — SEDIMENTATION RATE: SED RATE: 19 mm/h — AB (ref 0–15)

## 2017-07-06 LAB — DAT, POLYSPECIFIC AHG (ARMC ONLY): Polyspecific AHG test: NEGATIVE

## 2017-07-07 LAB — PLATELET ANTIBODY PROFILE
GLYCOPROTEIN IV ANTIBODY: NEGATIVE
HLA AB SER QL EIA: NEGATIVE
IA/IIA ANTIBODY: NEGATIVE
IB/IX ANTIBODY: NEGATIVE
IIB/IIIA ANTIBODY: NEGATIVE

## 2017-07-07 LAB — HEMOGLOBINOPATHY EVALUATION
HGB VARIANT: 0 %
Hgb A2 Quant: 2.3 % (ref 1.8–3.2)
Hgb A: 97.7 % (ref 96.4–98.8)
Hgb C: 0 %
Hgb F Quant: 0 % (ref 0.0–2.0)
Hgb S Quant: 0 %

## 2017-07-07 LAB — ERYTHROPOIETIN: Erythropoietin: 24.7 m[IU]/mL — ABNORMAL HIGH (ref 2.6–18.5)

## 2017-07-07 LAB — HAPTOGLOBIN: HAPTOGLOBIN: 38 mg/dL (ref 34–200)

## 2017-07-20 DIAGNOSIS — M7521 Bicipital tendinitis, right shoulder: Secondary | ICD-10-CM | POA: Diagnosis not present

## 2017-07-24 NOTE — Progress Notes (Signed)
[place]  [phone] [phone]  ID: Ernesto Zukowski Seger OB: 08-01-1971  MR#: [id number]  VPX#:[id number]  Patient Care Team: Kirk Ruths, MD as PCP - General (Internal Medicine)  CHIEF COMPLAINT: Chronic ITP, mild anemia  INTERVAL HISTORY: Patient returns to clinic today for repeat laboratory work and further evaluation. He currently feels well and is asymptomatic. He does not complain of weakness and fatigue today. He has no neurologic complaints. He denies any recent fevers. He has a good appetite and denies weight loss. He has no chest pain or shortness of breath. He denies any nausea, vomiting, constipation, or diarrhea. He has no melena or hematochezia. He denies any easy bleeding or bruising. He has no urinary complaints. Patient feels at his baseline and offers no specific complaints today.  REVIEW OF SYSTEMS:   Review of Systems  Constitutional: Negative.  Negative for fever, malaise/fatigue and weight loss.  HENT: Negative.  Negative for nosebleeds.   Respiratory: Negative.  Negative for cough and shortness of breath.   Cardiovascular: Negative.  Negative for chest pain and leg swelling.  Gastrointestinal: Negative.  Negative for diarrhea and melena.  Genitourinary: Negative.  Negative for frequency and hematuria.  Musculoskeletal: Negative.   Skin: Negative.  Negative for rash.  Neurological: Negative.  Negative for sensory change and weakness.  Psychiatric/Behavioral: Negative.  The patient is not nervous/anxious.     As per HPI. Otherwise, a complete review of systems is negative.  PAST MEDICAL HISTORY: Past Medical History:  Diagnosis Date  . Seizures ([place])     PAST SURGICAL HISTORY: Past Surgical History:  Procedure Laterality Date  . PILONIDAL CYST EXCISION  1996  . TESTICULAR EXPLORATION  1978    FAMILY HISTORY: Family History  Problem Relation Age of Onset  . Hypertension Mother   . Hyperlipidemia Mother    . Asthma Mother   . Cancer Maternal Grandmother        pancreatic    ADVANCED DIRECTIVES (Y/N):  N  HEALTH MAINTENANCE: Social History  Substance Use Topics  . Smoking status: Current Every Day Smoker    Packs/day: 1.00    Years: 30.00  . Smokeless tobacco: Never Used  . Alcohol use No     Colonoscopy:  PAP:  Bone density:  Lipid panel:  Allergies  Allergen Reactions  . Asa [Aspirin] Swelling    Current Outpatient Prescriptions  Medication Sig Dispense Refill  . carbamazepine (TEGRETOL XR) 200 MG 12 hr tablet Take 800 mg by mouth daily.     . butalbital-acetaminophen-caffeine (FIORICET, ESGIC) 50-325-40 MG tablet Take 1-2 tablets by mouth every 6 (six) hours as needed for headache. (Patient not taking: Reported on 07/06/2017) 20 tablet 0   No current facility-administered medications for this visit.     OBJECTIVE: Vitals:   07/27/17 1531  BP: 113/75  Pulse: 64  Resp: 18  Temp: 97.8 F (36.6 C)     Body mass index is 34.3 kg/m.    ECOG FS:0 - Asymptomatic  General: Well-developed, well-nourished, no acute distress. Eyes: Pink conjunctiva, anicteric sclera. Lungs: Clear to auscultation bilaterally. Heart: Regular rate and rhythm. No rubs, murmurs, or gallops. Abdomen: Soft, nontender, nondistended. No organomegaly noted, normoactive bowel sounds. Musculoskeletal: No edema, cyanosis, or clubbing. Neuro: Alert, answering all questions appropriately. Cranial nerves grossly intact. Skin: No rashes or petechiae noted. Psych: Normal affect.   LAB RESULTS:  Lab Results  Component Value Date   NA 140 06/16/2017   K 4.1 06/16/2017  CL 107 06/16/2017   CO2 27 06/16/2017   GLUCOSE 112 (H) 06/16/2017   BUN 20 06/16/2017   CREATININE 1.02 06/16/2017   CALCIUM 8.9 06/16/2017   PROT 7.1 06/16/2017   ALBUMIN 4.4 06/16/2017   AST 19 06/16/2017   ALT 16 (L) 06/16/2017   ALKPHOS 69 06/16/2017   BILITOT 1.4 (H) 06/16/2017   GFRNONAA >60 06/16/2017   GFRAA >60  06/16/2017    Lab Results  Component Value Date   WBC 9.0 07/27/2017   NEUTROABS 6.1 07/27/2017   HGB 11.4 (L) 07/27/2017   HCT 30.9 (L) 07/27/2017   MCV 90.3 07/27/2017   PLT 133 (L) 07/27/2017     STUDIES: No results found.  ASSESSMENT: Chronic ITP, mild anemia.  PLAN:    1. Chronic ITP: Patient's most recent platelet count is 133 which appears to be his baseline and unchanged for at least 2-1/2 years. Possibly suppressed secondary to his chronic use of Tegretol. Previously, patient was also noted to have positive platelet antibodies but on repeat laboratory work today they are now negative. No further intervention is needed. 2. Mild anemia: Patient's hemoglobin is decreased, but stable and unchanged. Hemoccult, iron stores, B 12, folate, and M spike were all negative or within normal limits. He was noted to have an appropriately elevated reticulocyte count. Hemoglobinopathy profile and hemolysis labs are also within normal limits. Patient does not require bone marrow biopsy. After discussion with the patient, it was agreed upon that no further follow-up is necessary. Please refer patient back if he becomes symptomatic or his hemoglobin continues to trend down.   Approximately 20 minutes was spent in discussion of which greater than 50% was consultation.  Patient expressed understanding and was in agreement with this plan. He also understands that He can call clinic at any time with any questions, concerns, or complaints.    Lloyd Huger, MD   07/27/2017 3:59 PM

## 2017-07-27 ENCOUNTER — Inpatient Hospital Stay (HOSPITAL_BASED_OUTPATIENT_CLINIC_OR_DEPARTMENT_OTHER): Payer: 59 | Admitting: Oncology

## 2017-07-27 ENCOUNTER — Inpatient Hospital Stay: Payer: 59 | Attending: Oncology

## 2017-07-27 VITALS — BP 113/75 | HR 64 | Temp 97.8°F | Resp 18 | Wt 225.6 lb

## 2017-07-27 DIAGNOSIS — G40909 Epilepsy, unspecified, not intractable, without status epilepticus: Secondary | ICD-10-CM

## 2017-07-27 DIAGNOSIS — Z800 Family history of malignant neoplasm of digestive organs: Secondary | ICD-10-CM | POA: Diagnosis not present

## 2017-07-27 DIAGNOSIS — D693 Immune thrombocytopenic purpura: Secondary | ICD-10-CM | POA: Diagnosis not present

## 2017-07-27 DIAGNOSIS — D649 Anemia, unspecified: Secondary | ICD-10-CM | POA: Diagnosis not present

## 2017-07-27 DIAGNOSIS — Z79899 Other long term (current) drug therapy: Secondary | ICD-10-CM | POA: Diagnosis not present

## 2017-07-27 DIAGNOSIS — Z886 Allergy status to analgesic agent status: Secondary | ICD-10-CM | POA: Insufficient documentation

## 2017-07-27 DIAGNOSIS — F17210 Nicotine dependence, cigarettes, uncomplicated: Secondary | ICD-10-CM

## 2017-07-27 LAB — CBC WITH DIFFERENTIAL/PLATELET
Basophils Absolute: 0 10*3/uL (ref 0–0.1)
Basophils Relative: 0 %
EOS PCT: 2 %
Eosinophils Absolute: 0.2 10*3/uL (ref 0–0.7)
HEMATOCRIT: 30.9 % — AB (ref 40.0–52.0)
Hemoglobin: 11.4 g/dL — ABNORMAL LOW (ref 13.0–18.0)
LYMPHS ABS: 1.9 10*3/uL (ref 1.0–3.6)
LYMPHS PCT: 21 %
MCH: 33.3 pg (ref 26.0–34.0)
MCHC: 36.9 g/dL — ABNORMAL HIGH (ref 32.0–36.0)
MCV: 90.3 fL (ref 80.0–100.0)
Monocytes Absolute: 0.7 10*3/uL (ref 0.2–1.0)
Monocytes Relative: 8 %
NEUTROS ABS: 6.1 10*3/uL (ref 1.4–6.5)
Neutrophils Relative %: 69 %
PLATELETS: 133 10*3/uL — AB (ref 150–440)
RBC: 3.42 MIL/uL — AB (ref 4.40–5.90)
RDW: 19.2 % — ABNORMAL HIGH (ref 11.5–14.5)
WBC: 9 10*3/uL (ref 3.8–10.6)

## 2018-01-07 DIAGNOSIS — H40003 Preglaucoma, unspecified, bilateral: Secondary | ICD-10-CM | POA: Diagnosis not present

## 2018-02-24 DIAGNOSIS — D693 Immune thrombocytopenic purpura: Secondary | ICD-10-CM | POA: Diagnosis not present

## 2018-02-24 DIAGNOSIS — Z0000 Encounter for general adult medical examination without abnormal findings: Secondary | ICD-10-CM | POA: Diagnosis not present

## 2018-02-24 DIAGNOSIS — G40309 Generalized idiopathic epilepsy and epileptic syndromes, not intractable, without status epilepticus: Secondary | ICD-10-CM | POA: Diagnosis not present

## 2018-03-03 DIAGNOSIS — D580 Hereditary spherocytosis: Secondary | ICD-10-CM | POA: Diagnosis not present

## 2018-03-03 DIAGNOSIS — G40309 Generalized idiopathic epilepsy and epileptic syndromes, not intractable, without status epilepticus: Secondary | ICD-10-CM | POA: Diagnosis not present

## 2018-03-03 DIAGNOSIS — Z0000 Encounter for general adult medical examination without abnormal findings: Secondary | ICD-10-CM | POA: Diagnosis not present

## 2018-03-03 DIAGNOSIS — D693 Immune thrombocytopenic purpura: Secondary | ICD-10-CM | POA: Diagnosis not present

## 2018-04-27 ENCOUNTER — Other Ambulatory Visit: Payer: Self-pay | Admitting: Surgery

## 2018-04-27 DIAGNOSIS — N5089 Other specified disorders of the male genital organs: Secondary | ICD-10-CM | POA: Diagnosis not present

## 2018-04-27 DIAGNOSIS — R1011 Right upper quadrant pain: Secondary | ICD-10-CM | POA: Diagnosis not present

## 2018-05-10 ENCOUNTER — Other Ambulatory Visit: Payer: Self-pay

## 2018-05-10 ENCOUNTER — Ambulatory Visit
Admission: RE | Admit: 2018-05-10 | Discharge: 2018-05-10 | Disposition: A | Discharge status: Home / Self Care | Payer: 59 | Source: Ambulatory Visit | Attending: Surgery | Admitting: Surgery

## 2018-05-10 DIAGNOSIS — K824 Cholesterolosis of gallbladder: Secondary | ICD-10-CM | POA: Insufficient documentation

## 2018-05-10 DIAGNOSIS — R1011 Right upper quadrant pain: Secondary | ICD-10-CM | POA: Diagnosis not present

## 2018-05-10 DIAGNOSIS — K822 Perforation of gallbladder: Secondary | ICD-10-CM | POA: Diagnosis not present

## 2018-06-03 ENCOUNTER — Ambulatory Visit: Payer: Self-pay | Admitting: Surgery

## 2018-07-06 DIAGNOSIS — H40003 Preglaucoma, unspecified, bilateral: Secondary | ICD-10-CM | POA: Diagnosis not present

## 2018-07-13 DIAGNOSIS — H40003 Preglaucoma, unspecified, bilateral: Secondary | ICD-10-CM | POA: Diagnosis not present

## 2018-07-20 ENCOUNTER — Encounter (HOSPITAL_COMMUNITY): Payer: Self-pay

## 2018-07-20 ENCOUNTER — Other Ambulatory Visit: Payer: Self-pay

## 2018-07-20 ENCOUNTER — Encounter (HOSPITAL_COMMUNITY)
Admission: RE | Admit: 2018-07-20 | Discharge: 2018-07-20 | Disposition: A | Discharge status: Home / Self Care | Payer: 59 | Source: Ambulatory Visit | Attending: Surgery | Admitting: Surgery

## 2018-07-20 DIAGNOSIS — Z01812 Encounter for preprocedural laboratory examination: Secondary | ICD-10-CM | POA: Diagnosis not present

## 2018-07-20 HISTORY — DX: Disease of blood and blood-forming organs, unspecified: D75.9

## 2018-07-20 HISTORY — DX: Hereditary spherocytosis: D58.0

## 2018-07-20 LAB — BASIC METABOLIC PANEL
ANION GAP: 9 (ref 5–15)
BUN: 16 mg/dL (ref 6–20)
CALCIUM: 8.7 mg/dL — AB (ref 8.9–10.3)
CO2: 25 mmol/L (ref 22–32)
Chloride: 107 mmol/L (ref 98–111)
Creatinine, Ser: 0.94 mg/dL (ref 0.61–1.24)
GFR calc Af Amer: >60 mL/min (ref >60)
GFR calc non Af Amer: >60 mL/min (ref >60)
GLUCOSE: 104 mg/dL — AB (ref 70–99)
Potassium: 3.8 mmol/L (ref 3.5–5.1)
Sodium: 141 mmol/L (ref 135–145)

## 2018-07-20 LAB — CBC
HEMATOCRIT: 30.5 % — AB (ref 39.0–52.0)
Hemoglobin: 10.4 g/dL — ABNORMAL LOW (ref 13.0–17.0)
MCH: 33.1 pg (ref 26.0–34.0)
MCHC: 34.1 g/dL (ref 30.0–36.0)
MCV: 97.1 fL (ref 78.0–100.0)
Platelets: 138 10*3/uL — ABNORMAL LOW (ref 150–400)
RBC: 3.14 MIL/uL — ABNORMAL LOW (ref 4.22–5.81)
RDW: 16.6 % — AB (ref 11.5–15.5)
WBC: 6.8 10*3/uL (ref 4.0–10.5)

## 2018-07-20 NOTE — Pre-Procedure Instructions (Signed)
Wagner Tanzi Mcdaniel  07/20/2018      [place], [place] - [address] [place] [address] Phone: [phone] Fax: [phone]    Your procedure is scheduled on   Thursday 07/28/18  Report to [facility] at 700 A.M.  Call this number if you have problems the morning of surgery:  [phone]   Remember:  Do not eat  after midnight.  You may drink clear liquids until 600AM .  Clear liquids allowed are:                    Water, Juice (non-citric and without pulp), Carbonated beverages, Clear Tea, Black Coffee only and Gatorade    Take these medicines the morning of surgery with A SIP OF WATER- CARBAMAZEPINE (TEGRETOL)  7 days prior to surgery STOP taking any Aspirin(unless otherwise instructed by your surgeon), Aleve, Naproxen, Ibuprofen, Motrin, Advil, Goody's, BC's, all herbal medications, fish oil, and all vitamins    Do not wear jewelry, make-up or nail polish.  Do not wear lotions, powders, or perfumes, or deodorant.  Do not shave 48 hours prior to surgery.  Men may shave face and neck.  Do not bring valuables to the hospital.  [facility] is not responsible for any belongings or valuables.  Contacts, dentures or bridgework may not be worn into surgery.  Leave your suitcase in the car.  After surgery it may be brought to your room.  For patients admitted to the hospital, discharge time will be determined by your treatment team.  Patients discharged the day of surgery will not be allowed to drive home.   Name and phone number of your driver:    Special instructions:  [facility] - Preparing for Surgery  Before surgery, you can play an important role.  Because skin is not sterile, your skin needs to be as free of germs as possible.  You can reduce the number of germs on you skin by washing with CHG (chlorahexidine gluconate) soap before surgery.  CHG is an antiseptic cleaner which kills germs  and bonds with the skin to continue killing germs even after washing.  Oral Hygiene is also important in reducing the risk of infection.  Remember to brush your teeth with your regular toothpaste the morning of surgery.  Please DO NOT use if you have an allergy to CHG or antibacterial soaps.  If your skin becomes reddened/irritated stop using the CHG and inform your nurse when you arrive at Short Stay.  Do not shave (including legs and underarms) for at least 48 hours prior to the first CHG shower.  You may shave your face.  Please follow these instructions carefully:   1.  Shower with CHG Soap the night before surgery and the morning of Surgery.  2.  If you choose to wash your hair, wash your hair first as usual with your normal shampoo.  3.  After you shampoo, rinse your hair and body thoroughly to remove the shampoo. 4.  Use CHG as you would any other liquid soap.  You can apply chg directly to the skin and wash gently with a      scrungie or washcloth.           5.  Apply the CHG Soap to your body ONLY FROM THE NECK DOWN.   Do not use on open wounds or open sores. Avoid contact with your eyes, ears,  mouth and genitals (private parts).  Wash genitals (private parts) with your normal soap.  6.  Wash thoroughly, paying special attention to the area where your surgery will be performed.  7.  Thoroughly rinse your body with warm water from the neck down.  8.  DO NOT shower/wash with your normal soap after using and rinsing off the CHG Soap.  9.  Pat yourself dry with a clean towel.            10.  Wear clean pajamas.            11.  Place clean sheets on your bed the night of your first shower and do not sleep with pets.  Day of Surgery  Do not apply any lotions/deoderants the morning of surgery.   Please wear clean clothes to the hospital/surgery center. Remember to brush your teeth with toothpaste.     Please read over the following fact sheets that you were given. Pain  Booklet

## 2018-07-28 ENCOUNTER — Ambulatory Visit (HOSPITAL_COMMUNITY)
Admission: RE | Admit: 2018-07-28 | Discharge: 2018-07-28 | Disposition: A | Discharge status: Home / Self Care | Payer: 59 | Source: Ambulatory Visit | Attending: Surgery | Admitting: Surgery

## 2018-07-28 ENCOUNTER — Encounter (HOSPITAL_COMMUNITY)
Admission: RE | Disposition: A | Discharge status: Home / Self Care | Payer: Self-pay | Source: Ambulatory Visit | Attending: Surgery

## 2018-07-28 ENCOUNTER — Ambulatory Visit (HOSPITAL_COMMUNITY): Payer: 59 | Admitting: Anesthesiology

## 2018-07-28 ENCOUNTER — Ambulatory Visit (HOSPITAL_COMMUNITY): Payer: 59

## 2018-07-28 ENCOUNTER — Encounter (HOSPITAL_COMMUNITY): Payer: Self-pay | Admitting: *Deleted

## 2018-07-28 DIAGNOSIS — K811 Chronic cholecystitis: Secondary | ICD-10-CM | POA: Insufficient documentation

## 2018-07-28 DIAGNOSIS — L723 Sebaceous cyst: Secondary | ICD-10-CM | POA: Diagnosis not present

## 2018-07-28 DIAGNOSIS — Z886 Allergy status to analgesic agent status: Secondary | ICD-10-CM | POA: Diagnosis not present

## 2018-07-28 DIAGNOSIS — Z419 Encounter for procedure for purposes other than remedying health state, unspecified: Secondary | ICD-10-CM

## 2018-07-28 DIAGNOSIS — Z79899 Other long term (current) drug therapy: Secondary | ICD-10-CM | POA: Diagnosis not present

## 2018-07-28 DIAGNOSIS — N5089 Other specified disorders of the male genital organs: Secondary | ICD-10-CM | POA: Diagnosis not present

## 2018-07-28 DIAGNOSIS — K8050 Calculus of bile duct without cholangitis or cholecystitis without obstruction: Secondary | ICD-10-CM | POA: Diagnosis not present

## 2018-07-28 DIAGNOSIS — G40909 Epilepsy, unspecified, not intractable, without status epilepticus: Secondary | ICD-10-CM | POA: Diagnosis not present

## 2018-07-28 DIAGNOSIS — L089 Local infection of the skin and subcutaneous tissue, unspecified: Secondary | ICD-10-CM | POA: Diagnosis not present

## 2018-07-28 DIAGNOSIS — D693 Immune thrombocytopenic purpura: Secondary | ICD-10-CM | POA: Diagnosis not present

## 2018-07-28 DIAGNOSIS — K8010 Calculus of gallbladder with chronic cholecystitis without obstruction: Secondary | ICD-10-CM | POA: Diagnosis not present

## 2018-07-28 DIAGNOSIS — F17200 Nicotine dependence, unspecified, uncomplicated: Secondary | ICD-10-CM | POA: Insufficient documentation

## 2018-07-28 HISTORY — PX: CHOLECYSTECTOMY: SHX55

## 2018-07-28 HISTORY — PX: CYST EXCISION PERINEAL: SHX6278

## 2018-07-28 SURGERY — LAPAROSCOPIC CHOLECYSTECTOMY WITH INTRAOPERATIVE CHOLANGIOGRAM
Anesthesia: General | Site: Perineum

## 2018-07-28 MED ORDER — BUPIVACAINE-EPINEPHRINE 0.25% -1:200000 IJ SOLN
INTRAMUSCULAR | Status: DC | PRN
  Administered 2018-07-28: 10 mL
  Administered 2018-07-28: 14 mL

## 2018-07-28 MED ORDER — IOPAMIDOL (ISOVUE-300) INJECTION 61%
INTRAVENOUS | Status: AC
  Filled 2018-07-28: qty 50

## 2018-07-28 MED ORDER — GABAPENTIN 300 MG PO CAPS
300.0000 mg | ORAL_CAPSULE | ORAL | Status: AC
  Administered 2018-07-28: 300 mg via ORAL

## 2018-07-28 MED ORDER — HYDROMORPHONE HCL 1 MG/ML IJ SOLN
0.2500 mg | INTRAMUSCULAR | Status: DC | PRN
  Administered 2018-07-28 (×2): 0.5 mg via INTRAVENOUS

## 2018-07-28 MED ORDER — ONDANSETRON HCL 4 MG/2ML IJ SOLN
INTRAMUSCULAR | Status: DC | PRN
  Administered 2018-07-28: 4 mg via INTRAVENOUS

## 2018-07-28 MED ORDER — ACETAMINOPHEN 500 MG PO TABS
ORAL_TABLET | ORAL | Status: AC
  Administered 2018-07-28: 1000 mg via ORAL
  Filled 2018-07-28: qty 2

## 2018-07-28 MED ORDER — ONDANSETRON HCL 4 MG/2ML IJ SOLN
INTRAMUSCULAR | Status: AC
  Filled 2018-07-28: qty 2

## 2018-07-28 MED ORDER — STERILE WATER FOR IRRIGATION IR SOLN
Status: DC | PRN
  Administered 2018-07-28: 1000 mL

## 2018-07-28 MED ORDER — DEXAMETHASONE SODIUM PHOSPHATE 10 MG/ML IJ SOLN
INTRAMUSCULAR | Status: AC
  Filled 2018-07-28: qty 1

## 2018-07-28 MED ORDER — BUPIVACAINE-EPINEPHRINE (PF) 0.25% -1:200000 IJ SOLN
INTRAMUSCULAR | Status: AC
  Filled 2018-07-28: qty 30

## 2018-07-28 MED ORDER — SODIUM CHLORIDE 0.9 % IV SOLN
INTRAVENOUS | Status: DC | PRN
  Administered 2018-07-28: 13 mL

## 2018-07-28 MED ORDER — OXYCODONE HCL 5 MG PO TABS: 5.0000 mg | ORAL_TABLET | Freq: Four times a day (QID) | ORAL | 0 refills | Status: DC | PRN

## 2018-07-28 MED ORDER — HYDROMORPHONE HCL 1 MG/ML IJ SOLN
INTRAMUSCULAR | Status: AC
  Filled 2018-07-28: qty 1

## 2018-07-28 MED ORDER — SUGAMMADEX SODIUM 200 MG/2ML IV SOLN
INTRAVENOUS | Status: DC | PRN
  Administered 2018-07-28: 200 mg via INTRAVENOUS

## 2018-07-28 MED ORDER — ROCURONIUM BROMIDE 50 MG/5ML IV SOSY
PREFILLED_SYRINGE | INTRAVENOUS | Status: DC | PRN
  Administered 2018-07-28: 10 mg via INTRAVENOUS
  Administered 2018-07-28: 45 mg via INTRAVENOUS
  Administered 2018-07-28: 5 mg via INTRAVENOUS

## 2018-07-28 MED ORDER — ROCURONIUM BROMIDE 50 MG/5ML IV SOSY
PREFILLED_SYRINGE | INTRAVENOUS | Status: AC
  Filled 2018-07-28: qty 5

## 2018-07-28 MED ORDER — LIDOCAINE 2% (20 MG/ML) 5 ML SYRINGE
INTRAMUSCULAR | Status: DC | PRN
  Administered 2018-07-28: 60 mg via INTRAVENOUS

## 2018-07-28 MED ORDER — MIDAZOLAM HCL 5 MG/5ML IJ SOLN
INTRAMUSCULAR | Status: DC | PRN
  Administered 2018-07-28: 2 mg via INTRAVENOUS

## 2018-07-28 MED ORDER — PROMETHAZINE HCL 25 MG/ML IJ SOLN
6.2500 mg | Freq: Once | INTRAMUSCULAR | Status: AC
  Administered 2018-07-28: 6.25 mg via INTRAVENOUS

## 2018-07-28 MED ORDER — SODIUM CHLORIDE 0.9 % IR SOLN
Status: DC | PRN
  Administered 2018-07-28: 1000 mL

## 2018-07-28 MED ORDER — FENTANYL CITRATE (PF) 100 MCG/2ML IJ SOLN
INTRAMUSCULAR | Status: DC | PRN
  Administered 2018-07-28: 50 ug via INTRAVENOUS
  Administered 2018-07-28: 100 ug via INTRAVENOUS
  Administered 2018-07-28 (×2): 50 ug via INTRAVENOUS

## 2018-07-28 MED ORDER — CHLORHEXIDINE GLUCONATE CLOTH 2 % EX PADS: 6.0000 | MEDICATED_PAD | Freq: Once | CUTANEOUS | Status: DC

## 2018-07-28 MED ORDER — LIDOCAINE 2% (20 MG/ML) 5 ML SYRINGE
INTRAMUSCULAR | Status: AC
  Filled 2018-07-28: qty 5

## 2018-07-28 MED ORDER — LACTATED RINGERS IV SOLN
INTRAVENOUS | Status: DC
  Administered 2018-07-28: 07:00:00 via INTRAVENOUS

## 2018-07-28 MED ORDER — CEFAZOLIN SODIUM-DEXTROSE 2-4 GM/100ML-% IV SOLN
2.0000 g | INTRAVENOUS | Status: AC
  Administered 2018-07-28: 2 g via INTRAVENOUS

## 2018-07-28 MED ORDER — 0.9 % SODIUM CHLORIDE (POUR BTL) OPTIME
TOPICAL | Status: DC | PRN
  Administered 2018-07-28: 1000 mL

## 2018-07-28 MED ORDER — PROPOFOL 10 MG/ML IV BOLUS
INTRAVENOUS | Status: DC | PRN
  Administered 2018-07-28: 150 mg via INTRAVENOUS
  Administered 2018-07-28: 50 mg via INTRAVENOUS

## 2018-07-28 MED ORDER — MIDAZOLAM HCL 2 MG/2ML IJ SOLN
INTRAMUSCULAR | Status: AC
  Filled 2018-07-28: qty 2

## 2018-07-28 MED ORDER — ACETAMINOPHEN 500 MG PO TABS
1000.0000 mg | ORAL_TABLET | ORAL | Status: AC
  Administered 2018-07-28: 1000 mg via ORAL

## 2018-07-28 MED ORDER — GABAPENTIN 300 MG PO CAPS
ORAL_CAPSULE | ORAL | Status: AC
  Administered 2018-07-28: 300 mg via ORAL
  Filled 2018-07-28: qty 1

## 2018-07-28 MED ORDER — PROMETHAZINE HCL 25 MG/ML IJ SOLN
INTRAMUSCULAR | Status: AC
  Filled 2018-07-28: qty 1

## 2018-07-28 MED ORDER — CEFAZOLIN SODIUM-DEXTROSE 2-4 GM/100ML-% IV SOLN
INTRAVENOUS | Status: AC
  Filled 2018-07-28: qty 100

## 2018-07-28 MED ORDER — FENTANYL CITRATE (PF) 250 MCG/5ML IJ SOLN
INTRAMUSCULAR | Status: AC
  Filled 2018-07-28: qty 5

## 2018-07-28 SURGICAL SUPPLY — 57 items
APPLIER CLIP ROT 10 11.4 M/L (STAPLE) ×3
BENZOIN TINCTURE PRP APPL 2/3 (GAUZE/BANDAGES/DRESSINGS) ×3 IMPLANT
BLADE CLIPPER SURG (BLADE) ×3 IMPLANT
BLADE SURG 15 STRL LF DISP TIS (BLADE) ×2 IMPLANT
BLADE SURG 15 STRL SS (BLADE) ×1
CANISTER SUCT 3000ML PPV (MISCELLANEOUS) ×3 IMPLANT
CHLORAPREP W/TINT 26ML (MISCELLANEOUS) ×3 IMPLANT
CLIP APPLIE ROT 10 11.4 M/L (STAPLE) ×2 IMPLANT
COVER MAYO STAND STRL (DRAPES) ×3 IMPLANT
COVER SURGICAL LIGHT HANDLE (MISCELLANEOUS) ×3 IMPLANT
DERMABOND ADVANCED (GAUZE/BANDAGES/DRESSINGS) ×1
DERMABOND ADVANCED .7 DNX12 (GAUZE/BANDAGES/DRESSINGS) ×2 IMPLANT
DRAPE C-ARM 42X72 X-RAY (DRAPES) ×3 IMPLANT
DRAPE HALF SHEET 40X57 (DRAPES) ×3 IMPLANT
DRAPE LAPAROTOMY 100X72 PEDS (DRAPES) ×3 IMPLANT
DRSG TEGADERM 2-3/8X2-3/4 SM (GAUZE/BANDAGES/DRESSINGS) ×3 IMPLANT
DRSG TEGADERM 4X4.75 (GAUZE/BANDAGES/DRESSINGS) ×3 IMPLANT
ELECT CAUTERY BLADE 6.4 (BLADE) ×3 IMPLANT
ELECT REM PT RETURN 9FT ADLT (ELECTROSURGICAL) ×3
ELECTRODE REM PT RTRN 9FT ADLT (ELECTROSURGICAL) ×2 IMPLANT
FILTER SMOKE EVAC LAPAROSHD (FILTER) ×3 IMPLANT
GAUZE SPONGE 2X2 8PLY STRL LF (GAUZE/BANDAGES/DRESSINGS) ×2 IMPLANT
GLOVE BIO SURGEON STRL SZ7 (GLOVE) ×3 IMPLANT
GLOVE BIOGEL PI IND STRL 7.5 (GLOVE) ×2 IMPLANT
GLOVE BIOGEL PI INDICATOR 7.5 (GLOVE) ×1
GOWN STRL REUS W/ TWL LRG LVL3 (GOWN DISPOSABLE) ×6 IMPLANT
GOWN STRL REUS W/TWL LRG LVL3 (GOWN DISPOSABLE) ×3
KIT BASIN OR (CUSTOM PROCEDURE TRAY) ×3 IMPLANT
KIT TURNOVER KIT B (KITS) ×3 IMPLANT
LEGGING LITHOTOMY PAIR STRL (DRAPES) ×3 IMPLANT
NS IRRIG 1000ML POUR BTL (IV SOLUTION) ×3 IMPLANT
PAD ARMBOARD 7.5X6 YLW CONV (MISCELLANEOUS) ×3 IMPLANT
PENCIL BUTTON HOLSTER BLD 10FT (ELECTRODE) ×3 IMPLANT
POUCH RETRIEVAL ECOSAC 10 (ENDOMECHANICALS) IMPLANT
POUCH RETRIEVAL ECOSAC 10MM (ENDOMECHANICALS)
POUCH SPECIMEN RETRIEVAL 10MM (ENDOMECHANICALS) ×3 IMPLANT
SCISSORS LAP 5X35 DISP (ENDOMECHANICALS) ×3 IMPLANT
SET CHOLANGIOGRAPH 5 50 .035 (SET/KITS/TRAYS/PACK) ×3 IMPLANT
SET IRRIG TUBING LAPAROSCOPIC (IRRIGATION / IRRIGATOR) ×3 IMPLANT
SLEEVE ENDOPATH XCEL 5M (ENDOMECHANICALS) ×3 IMPLANT
SPECIMEN JAR SMALL (MISCELLANEOUS) ×3 IMPLANT
SPONGE GAUZE 2X2 STER 10/PKG (GAUZE/BANDAGES/DRESSINGS) ×1
STRIP CLOSURE SKIN 1/2X4 (GAUZE/BANDAGES/DRESSINGS) ×3 IMPLANT
SUT MNCRL AB 4-0 PS2 18 (SUTURE) ×3 IMPLANT
SUT VIC AB 3-0 SH 27 (SUTURE) ×1
SUT VIC AB 3-0 SH 27XBRD (SUTURE) ×2 IMPLANT
SYR BULB IRRIGATION 50ML (SYRINGE) ×3 IMPLANT
TOWEL OR 17X24 6PK STRL BLUE (TOWEL DISPOSABLE) ×3 IMPLANT
TOWEL OR 17X26 10 PK STRL BLUE (TOWEL DISPOSABLE) ×3 IMPLANT
TRAY LAPAROSCOPIC MC (CUSTOM PROCEDURE TRAY) ×3 IMPLANT
TROCAR XCEL BLUNT TIP 100MML (ENDOMECHANICALS) ×3 IMPLANT
TROCAR XCEL NON-BLD 11X100MML (ENDOMECHANICALS) ×3 IMPLANT
TROCAR XCEL NON-BLD 5MMX100MML (ENDOMECHANICALS) ×3 IMPLANT
TUBE CONNECTING 12X1/4 (SUCTIONS) ×3 IMPLANT
TUBING INSUFFLATION (TUBING) ×3 IMPLANT
WATER STERILE IRR 1000ML POUR (IV SOLUTION) ×3 IMPLANT
YANKAUER SUCT BULB TIP NO VENT (SUCTIONS) ×3 IMPLANT

## 2018-07-28 NOTE — Anesthesia Procedure Notes (Deleted)
Procedure Name: Intubation Date/Time: 07/28/2018 9:21 AM Performed by: Kyung Rudd, CRNA Pre-anesthesia Checklist: Patient identified, Emergency Drugs available, Suction available, Patient being monitored and Timeout performed Patient Re-evaluated:Patient Re-evaluated prior to induction Oxygen Delivery Method: Circle system utilized Preoxygenation: Pre-oxygenation with 100% oxygen Induction Type: IV induction Ventilation: Mask ventilation without difficulty Laryngoscope Size: Mac and 4 Grade View: Grade I Tube type: Oral Tube size: 7.5 mm Number of attempts: 1 Airway Equipment and Method: Stylet Placement Confirmation: ETT inserted through vocal cords under direct vision,  positive ETCO2 and breath sounds checked- equal and bilateral Secured at: 21 cm Tube secured with: Tape Dental Injury: Teeth and Oropharynx as per pre-operative assessment  Comments: AOI by C. Amedeo Plenty, CRNA.

## 2018-07-28 NOTE — Anesthesia Preprocedure Evaluation (Addendum)
Anesthesia Evaluation  Patient identified by MRN, date of birth, ID band Patient awake    Reviewed: Allergy & Precautions, H&P , NPO status , Patient's Chart, lab work & pertinent test results  Airway Mallampati: III  TM Distance: >3 FB Neck ROM: Full    Dental no notable dental hx. (+) Teeth Intact, Dental Advisory Given   Pulmonary Current Smoker,    Pulmonary exam normal breath sounds clear to auscultation       Cardiovascular negative cardio ROS   Rhythm:Regular Rate:Normal     Neuro/Psych Seizures -, Well Controlled,  negative psych ROS   GI/Hepatic negative GI ROS, Neg liver ROS,   Endo/Other  negative endocrine ROS  Renal/GU negative Renal ROS  negative genitourinary   Musculoskeletal   Abdominal   Peds  Hematology  (+) Blood dyscrasia, ,   Anesthesia Other Findings   Reproductive/Obstetrics negative OB ROS                            Anesthesia Physical Anesthesia Plan  ASA: II  Anesthesia Plan: General   Post-op Pain Management:    Induction: Intravenous  PONV Risk Score and Plan: 2 and Ondansetron and Midazolam  Airway Management Planned: Oral ETT  Additional Equipment:   Intra-op Plan:   Post-operative Plan: Extubation in OR  Informed Consent: I have reviewed the patients History and Physical, chart, labs and discussed the procedure including the risks, benefits and alternatives for the proposed anesthesia with the patient or authorized representative who has indicated his/her understanding and acceptance.   Dental advisory given  Plan Discussed with: CRNA  Anesthesia Plan Comments:         Anesthesia Quick Evaluation

## 2018-07-28 NOTE — Anesthesia Postprocedure Evaluation (Signed)
Anesthesia Post Note  Patient: Colton Perez  Procedure(s) Performed: LAPAROSCOPIC CHOLECYSTECTOMY WITH INTRAOPERATIVE CHOLANGIOGRAM ERAS PATHWAY (N/A Abdomen) CYST EXCISION PERINEAL (N/A Perineum)     Patient location during evaluation: PACU Anesthesia Type: General Level of consciousness: awake and alert Pain management: pain level controlled Vital Signs Assessment: post-procedure vital signs reviewed and stable Respiratory status: spontaneous breathing, nonlabored ventilation and respiratory function stable Cardiovascular status: blood pressure returned to baseline and stable Postop Assessment: no apparent nausea or vomiting Anesthetic complications: no    Last Vitals:  Vitals:   07/28/18 1155 07/28/18 1201  BP: 115/68 113/73  Pulse: (!) 54 (!) 58  Resp: 12 12  Temp:  (!) 36.4 C  SpO2: 94% 96%    Last Pain:  Vitals:   07/28/18 1201  TempSrc:   PainSc: 4                  Waseem Suess,W. EDMOND

## 2018-07-28 NOTE — Discharge Instructions (Signed)
[facility] °LAPAROSCOPIC SURGERY: POST OP INSTRUCTIONS °Always review your discharge instruction sheet given to you by the facility where your surgery was performed. °IF YOU HAVE DISABILITY OR FAMILY LEAVE FORMS, YOU MUST BRING THEM TO THE OFFICE FOR PROCESSING.   °DO NOT GIVE THEM TO YOUR DOCTOR. ° °1. A prescription for pain medication will be given to you upon discharge.  Take your pain medication as prescribed, if needed.  If narcotic pain medicine is not needed, then you may take acetaminophen (Tylenol) or ibuprofen (Advil) as needed. °2. Take your usually prescribed medications unless otherwise directed. °3. If you need a refill on your pain medication, please contact your pharmacy.  They will contact our office to request authorization. Prescriptions will not be filled after 5pm or on week-ends. °4. You should follow a light diet the first few days after arrival home, such as soup and crackers, etc.  Be sure to include lots of fluids daily. °5. Most patients will experience some swelling and bruising in the area of the incisions.  Ice packs will help.  Swelling and bruising can take several days to resolve.  °6. It is common to experience some constipation if taking pain medication after surgery.  Increasing fluid intake and taking a stool softener (such as Colace) will usually help or prevent this problem from occurring.  A mild laxative (Milk of Magnesia or Miralax) should be taken according to package instructions if there are no bowel movements after 48 hours. °7. Unless discharge instructions indicate otherwise, you may remove your bandages 48 hours after surgery, and you may shower at that time.  You will have steri-strips (small skin tapes) in place directly over the incision.  These strips should be left on the skin for 7-10 days.  If your surgeon used skin glue on the incision, you may shower in 24 hours.  The glue will flake off over the next 2-3 weeks.  Any sutures or staples  will be removed at the office during your follow-up visit. °8. ACTIVITIES:  You may resume regular (light) daily activities beginning the next day--such as daily self-care, walking, climbing stairs--gradually increasing activities as tolerated.  You may have sexual intercourse when it is comfortable.  Refrain from any heavy lifting or straining until approved by your doctor. °a. You may drive when you are no longer taking prescription pain medication, you can comfortably wear a seatbelt, and you can safely maneuver your car and apply brakes. °b. RETURN TO WORK:   2-3 weeks °9. You should see your doctor in the office for a follow-up appointment approximately 2-3 weeks after your surgery.  Make sure that you call for this appointment within a day or two after you arrive home to insure a convenient appointment time. °10. OTHER INSTRUCTIONS: ________________________________________________________________________ °WHEN TO CALL YOUR DOCTOR: °1. Fever over 101.0 °2. Inability to urinate °3. Continued bleeding from incision. °4. Increased pain, redness, or drainage from the incision. °5. Increasing abdominal pain ° °The clinic staff is available to answer your questions during regular business hours.  Please don’t hesitate to call and ask to speak to one of the nurses for clinical concerns.  If you have a medical emergency, go to the nearest emergency room or call 911.  A surgeon from [facility] is always on call at the hospital. °[address], [address], [place], [place]  [address] ? [address] [address], [place], [place]   [address] °[phone] ? [phone] ? FAX [phone] °Web site:   [email or web address] ° °

## 2018-07-28 NOTE — Transfer of Care (Signed)
Immediate Anesthesia Transfer of Care Note  Patient: Colton Perez  Procedure(s) Performed: LAPAROSCOPIC CHOLECYSTECTOMY WITH INTRAOPERATIVE CHOLANGIOGRAM ERAS PATHWAY (N/A Abdomen) CYST EXCISION PERINEAL (N/A Perineum)  Patient Location: PACU  Anesthesia Type:General  Level of Consciousness: awake, alert  and oriented  Airway & Oxygen Therapy: Patient Spontanous Breathing and Patient connected to nasal cannula oxygen  Post-op Assessment: Report given to RN, Post -op Vital signs reviewed and stable and Patient moving all extremities  Post vital signs: Reviewed and stable  Last Vitals:  Vitals Value Taken Time  BP 122/74 07/28/2018 11:12 AM  Temp    Pulse 78 07/28/2018 11:13 AM  Resp 17 07/28/2018 11:13 AM  SpO2 100 % 07/28/2018 11:13 AM  Vitals shown include unvalidated device data.  Last Pain:  Vitals:   07/28/18 0709  TempSrc: Oral         Complications: No apparent anesthesia complications

## 2018-07-28 NOTE — H&P (Signed)
History of Present Illness  The patient is a 47 year old male who presents with non-malignant abdominal pain. 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 Occasionally this enlarges and begins to drain some whitish fluid. It has never become erythematous or exquisitely painful. He manages this by occasionally squeezing some of the drainage out when it starts to get larger. No imaging of this area.  For the last 2 months the patient has experienced a couple of episodes of fairly severe right upper quadrant pain that wraps around his upper abdomen. This has been associated with bloating, diarrhea, nausea, and vomiting. This test get worse after eating. He has had some mild symptoms since that time. He has not had any workup or imaging of his gallbladder.  The patient was followed by Dr. Grayland Ormond for chronic ITP and anemia. His most recent labs in September 2018 showed platelet count of 133 and a hemoglobin of 11.4. Total bilirubin at that time was 1.4. Other liver function tests were normal.     Past Surgical History  Oral Surgery   Diagnostic Studies History  Colonoscopy  never  Allergies Aspirin *ANALGESICS - NonNarcotic*  Swelling. Allergies Reconciled   Medication History  TEGretol-XR (200MG  Tablet ER 12HR, Oral) Active. Medications Reconciled  Social History Alcohol use  Occasional alcohol use. Caffeine use  Carbonated beverages. No drug use  Tobacco use  Current every day smoker.  Family History  Hypertension  Mother.  Other Problems Other disease, cancer, significant illness  Seizure Disorder     Review of Systems  General Present- Fatigue. Not Present- Appetite Loss, Chills, Fever, Night Sweats, Weight Gain and Weight Loss. Skin Not Present- Change in Wart/Mole, Dryness, Hives, Jaundice,  New Lesions, Non-Healing Wounds, Rash and Ulcer. HEENT Not Present- Earache, Hearing Loss, Hoarseness, Nose Bleed, Oral Ulcers, Ringing in the Ears, Seasonal Allergies, Sinus Pain, Sore Throat, Visual Disturbances, Wears glasses/contact lenses and Yellow Eyes. Respiratory Present- Chronic Cough. Not Present- Bloody sputum, Difficulty Breathing, Snoring and Wheezing. Breast Not Present- Breast Mass, Breast Pain, Nipple Discharge and Skin Changes. Cardiovascular Not Present- Chest Pain, Difficulty Breathing Lying Down, Leg Cramps, Palpitations, Rapid Heart Rate, Shortness of Breath and Swelling of Extremities. Gastrointestinal Present- Abdominal Pain and Bloating. Not Present- Bloody Stool, Change in Bowel Habits, Chronic diarrhea, Constipation, Difficulty Swallowing, Excessive gas, Gets full quickly at meals, Hemorrhoids, Indigestion, Nausea, Rectal Pain and Vomiting. Male Genitourinary Not Present- Blood in Urine, Change in Urinary Stream, Frequency, Impotence, Nocturia, Painful Urination, Urgency and Urine Leakage. Musculoskeletal Present- Back Pain. Not Present- Joint Pain, Joint Stiffness, Muscle Pain, Muscle Weakness and Swelling of Extremities. Neurological Present- Seizures. Not Present- Decreased Memory, Fainting, Headaches, Numbness, Tingling, Tremor, Trouble walking and Weakness. Psychiatric Not Present- Anxiety, Bipolar, Change in Sleep Pattern, Depression, Fearful and Frequent crying. Endocrine Not Present- Cold Intolerance, Excessive Hunger, Hair Changes, Heat Intolerance, Hot flashes and New Diabetes. Hematology Not Present- Blood Thinners, Easy Bruising, Excessive bleeding, Gland problems, HIV and Persistent Infections.  Vitals  Weight: 224 lb Height: 68in Body Surface Area: 2.14 m Body Mass Index: 34.06 kg/m  Temp.: 98.41F  Pulse: 80 (Regular)  BP: 118/78 (Sitting, Left Arm, Standard)       Physical Exam  The physical exam findings are as follows: Note:WDWN in  NAD Eyes: Pupils equal, round; sclera anicteric HENT: Oral mucosa moist; good  dentition Neck: No masses palpated, no thyromegaly Lungs: CTA bilaterally; normal respiratory effort CV: Regular rate and rhythm; no murmurs; extremities well-perfused with no edema Abd: +bowel sounds, soft, non-tender, no palpable organomegaly; no palpable hernias GU: healed pilonidal incisions; no sign of external hemorrhoids or perirectal abscess. In the perineum, there is a palpable 2 cm subcutaneous mass. When this area is compressed, a small amount of whitish drainage appears through a tiny opening. No erythema, induration, or tenderness. Skin: Warm, dry; no sign of jaundice Psychiatric - alert and oriented x 4; calm mood and affect    Assessment & Plan  POSTPRANDIAL ABDOMINAL PAIN IN RIGHT UPPER QUADRANT (R10.11) Current Plans GALLBLADDER US ([id number]) Schedule for Surgery. Plan laparoscopic cholecystectomy with intraoperative cholangiogram. We will also excise the perineal cyst wall under anesthesia. The surgical procedure has been discussed with the patient. Potential risks, benefits, alternative treatments, and expected outcomes have been explained. All of the patient's questions at this time have been answered. The likelihood of reaching the patient's treatment goal is good. The patient understand the proposed surgical procedure and wishes to proceed. PERINEAL CYST IN MALE (N50.89)   Imogene Burn. Georgette Dover, MD, [facility]  General/ Trauma Surgery Beeper [phone]  07/28/2018 8:38 AM

## 2018-07-28 NOTE — Op Note (Signed)
Laparoscopic Cholecystectomy with IOC/ excision of perineal cyst Procedure Note  Indications: This patient presents with symptomatic gallbladder disease and will undergo laparoscopic cholecystectomy.  He also has a draining cyst in the perineum that will be excised.  Pre-operative Diagnosis: Calculus of gallbladder with other cholecystitis, without mention of obstruction; perineal cyst  Post-operative Diagnosis: Same  Surgeon: Maia Petties   Assistants: none  Anesthesia: General endotracheal anesthesia  ASA Class: 1  Procedure Details  The patient was seen again in the Holding Room. The risks, benefits, complications, treatment options, and expected outcomes were discussed with the patient. The possibilities of reaction to medication, pulmonary aspiration, perforation of viscus, bleeding, recurrent infection, finding a normal gallbladder, the need for additional procedures, failure to diagnose a condition, the possible need to convert to an open procedure, and creating a complication requiring transfusion or operation were discussed with the patient. The likelihood of improving the patient's symptoms with return to their baseline status is good.  The patient and/or family concurred with the proposed plan, giving informed consent. The site of surgery properly noted. The patient was taken to Operating Room, identified as Colton Perez and the procedure verified as Laparoscopic Cholecystectomy with Intraoperative Cholangiogram. A Time Out was held and the above information confirmed.  Prior to the induction of general anesthesia, antibiotic prophylaxis was administered. General endotracheal anesthesia was then administered and tolerated well. After the induction, the abdomen was prepped with Chloraprep and draped in the sterile fashion. The patient was positioned in the supine position.  Local anesthetic agent was injected into the skin near the umbilicus and an incision made. We dissected  down to the abdominal fascia with blunt dissection.  The fascia was incised vertically and we entered the peritoneal cavity bluntly.  A pursestring suture of 0-Vicryl was placed around the fascial opening.  The Hasson cannula was inserted and secured with the stay suture.  Pneumoperitoneum was then created with CO2 and tolerated well without any adverse changes in the patient's vital signs. An 11-mm port was placed in the subxiphoid position.  Two 5-mm ports were placed in the right upper quadrant. All skin incisions were infiltrated with a local anesthetic agent before making the incision and placing the trocars.   We positioned the patient in reverse Trendelenburg, tilted slightly to the patient's left.  The gallbladder was identified, the fundus grasped and retracted cephalad.  Minimal adhesions were lysed bluntly and with the electrocautery where indicated, taking care not to injure any adjacent organs or viscus. The infundibulum was grasped and retracted laterally, exposing the peritoneum overlying the triangle of Calot. This was then divided and exposed in a blunt fashion. A critical view of the cystic duct and cystic artery was obtained.  The cystic duct was clearly identified and bluntly dissected circumferentially. The cystic duct was ligated with a clip distally.   An incision was made in the cystic duct and the [facility] cholangiogram catheter introduced. The catheter was secured using a clip. A cholangiogram was then obtained which showed good visualization of the distal and proximal biliary tree with no sign of filling defects or obstruction.  Contrast flowed easily into the duodenum. The catheter was then removed.   The cystic duct was then ligated with clips and divided. The cystic artery was identified, dissected free, ligated with clips and divided as well.   The gallbladder was dissected from the liver bed in retrograde fashion with the electrocautery. The gallbladder was removed and placed in an  Endocatch sac.  The liver bed was irrigated and inspected. Hemostasis was achieved with the electrocautery. Copious irrigation was utilized and was repeatedly aspirated until clear.  The gallbladder and Endocatch sac were then removed through the umbilical port site.  The pursestring suture was used to close the umbilical fascia.    We again inspected the right upper quadrant for hemostasis.  Pneumoperitoneum was released as we removed the trocars.  4-0 Monocryl was used to close the skin.   Benzoin, steri-strips, and clean dressings were applied.   We placed the patient's legs in yellowfin stirrups in lithotomy position.  We prepped his perineum with Betadine and draped in sterile fashion.  I could palpate a 2 cm area just to the left of midline between the posterior scrotum and the anterior anus.  When I palpate this area small amount of cloudy fluid is expressed through a small pinhole.  I made an incision over this area.  We dissected down into some firm scar tissue.  I excised the scar tissue with cautery.  We entered a small cavity containing some cloudy fluid.  I excised the back wall of the cyst.  However the cyst seemed to tunnel anteriorly.  We followed this tunnel but is seem to track several centimeters towards the posterior scrotum.  I was not prepared to enter the scrotum and had not discussed this with the patient.  Therefore we excised this much of the cyst as we could.  Cautery was used for hemostasis.  We irrigated the wound thoroughly.  No purulent drainage was noted.  We closed with a deep layer of 3-0 Vicryl and a subcuticular layer 4-0 Monocryl.  Dermabond was used to seal the incision.  The patient was then extubated and brought to the recovery room in stable condition. Instrument, sponge, and needle counts were correct at closure and at the conclusion of the case.   Findings: Cholecystitis with Cholelithiasis; perineal cyst with apparent draining tract from posterior  scrotum  Estimated Blood Loss: Minimal         Drains: none         Specimens: Gallbladder, perineal cyst       Complications: None; patient tolerated the procedure well.         Disposition: PACU - hemodynamically stable.         Condition: stable  Colton Burn. Georgette Dover, MD, [facility] Surgery  General/ Trauma Surgery Beeper [phone]  07/28/2018 10:56 AM

## 2018-07-29 ENCOUNTER — Encounter (HOSPITAL_COMMUNITY): Payer: Self-pay | Admitting: Surgery

## 2018-12-01 DIAGNOSIS — M7522 Bicipital tendinitis, left shoulder: Secondary | ICD-10-CM | POA: Diagnosis not present

## 2018-12-02 DIAGNOSIS — M7522 Bicipital tendinitis, left shoulder: Secondary | ICD-10-CM | POA: Diagnosis not present

## 2019-01-26 ENCOUNTER — Other Ambulatory Visit: Payer: Self-pay

## 2019-01-26 ENCOUNTER — Ambulatory Visit
Admission: EM | Admit: 2019-01-26 | Discharge: 2019-01-26 | Disposition: A | Discharge status: Home / Self Care | Payer: 59 | Attending: Emergency Medicine | Admitting: Emergency Medicine

## 2019-01-26 DIAGNOSIS — R51 Headache: Secondary | ICD-10-CM

## 2019-01-26 DIAGNOSIS — R05 Cough: Secondary | ICD-10-CM

## 2019-01-26 DIAGNOSIS — R69 Illness, unspecified: Secondary | ICD-10-CM

## 2019-01-26 DIAGNOSIS — R0982 Postnasal drip: Secondary | ICD-10-CM | POA: Diagnosis not present

## 2019-01-26 DIAGNOSIS — J029 Acute pharyngitis, unspecified: Secondary | ICD-10-CM | POA: Diagnosis not present

## 2019-01-26 DIAGNOSIS — J111 Influenza due to unidentified influenza virus with other respiratory manifestations: Secondary | ICD-10-CM

## 2019-01-26 DIAGNOSIS — B9789 Other viral agents as the cause of diseases classified elsewhere: Secondary | ICD-10-CM | POA: Diagnosis not present

## 2019-01-26 LAB — RAPID STREP SCREEN (MED CTR MEBANE ONLY): Streptococcus, Group A Screen (Direct): NEGATIVE

## 2019-01-26 LAB — RAPID INFLUENZA A&B ANTIGENS: Influenza A (ARMC): NEGATIVE

## 2019-01-26 LAB — RAPID INFLUENZA A&B ANTIGENS (ARMC ONLY): INFLUENZA B (ARMC): NEGATIVE

## 2019-01-26 MED ORDER — OSELTAMIVIR PHOSPHATE 75 MG PO CAPS: 75.0000 mg | ORAL_CAPSULE | Freq: Two times a day (BID) | ORAL | 0 refills | Status: DC

## 2019-01-26 MED ORDER — FLUTICASONE PROPIONATE 50 MCG/ACT NA SUSP: 2.0000 | Freq: Every day | NASAL | 0 refills | Status: DC

## 2019-01-26 MED ORDER — BALOXAVIR MARBOXIL(80 MG DOSE) 2 X 40 MG PO TBPK: 80.0000 mg | ORAL_TABLET | Freq: Once | ORAL | 0 refills | Status: AC

## 2019-01-26 NOTE — ED Triage Notes (Signed)
Patient complains of cough, fatigue, sore throat and weakness. Patient reports that wife was positive in here on Sunday for Flu B.

## 2019-01-26 NOTE — Discharge Instructions (Addendum)
Your rapid strep and flu were negative.  However I am treating you as if you have the flu because you have close contact with someone who has a confirmed case of influenza.  Xofluza, if this is too expensive or not available, Tamiflu.  Ibuprofen 600 mg combined with 1 g of Tylenol 3 or 4 times a day.  Electrolyte containing fluids and rest.  Flonase, saline nasal irrigation with a Milta Deiters med sinus rinse and distilled water as often as you want to prevent a bacterial sinus infection.

## 2019-01-26 NOTE — ED Provider Notes (Signed)
HPI  SUBJECTIVE:  Colton Perez is a 48 y.o. male who presents with the acute onset of sore throat, headache, cough productive of light green phlegm starting yesterday.  He reports postnasal drip and generalized weakness.  No wheezing, chest pain, shortness of breath, dyspnea on exertion, nasal congestion, rhinorrhea, sinus pain or pressure, body aches.  No voice changes, drooling, trismus, sensation of throat swelling shut.  No diarrhea, abdominal pain, rash, neck stiffness.  No antibiotics in the past month.  No antipyretic in the past 4 to 6 hours.  No recent travel or exposure to a COVID 19 patient.  His wife was diagnosed with influenza B here several days ago.  He is not coughing all night long.  He has been alternating 600 mg ibuprofen with 1 g of Tylenol every 4 hours, taking Mucinex.  No aggravating or alleviating factors.  Past medical history seizures, ITP, has not had a problem with this in several years.  He is a smoker.  No history of diabetes, hypertension, asthma, emphysema, COPD.  GXQ:JJHERDEY, Ocie Cornfield, MD   Past Medical History:  Diagnosis Date  . Blood dyscrasia   . Cytopenia    PSEUDOCYTOPENIA-    LOW   PLATELETS,  H+H    . Seizures ([place])   . Spherocytosis [facility])     Past Surgical History:  Procedure Laterality Date  . CHOLECYSTECTOMY N/A 07/28/2018   Procedure: LAPAROSCOPIC CHOLECYSTECTOMY WITH INTRAOPERATIVE CHOLANGIOGRAM ERAS PATHWAY;  Surgeon: Donnie Mesa, MD;  Location: [place];  Service: General;  Laterality: N/A;  . CYST EXCISION PERINEAL N/A 07/28/2018   Procedure: CYST EXCISION PERINEAL;  Surgeon: Donnie Mesa, MD;  Location: [place];  Service: General;  Laterality: N/A;  . [place]  . TESTICULAR EXPLORATION  1978  . WISDOM TOOTH EXTRACTION      Family History  Problem Relation Age of Onset  . Hypertension Mother   . Hyperlipidemia Mother   . Asthma Mother   . Cancer Maternal Grandmother        pancreatic    Social History    Tobacco Use  . Smoking status: Current Every Day Smoker    Packs/day: 1.00    Years: 30.00    Pack years: 30.00  . Smokeless tobacco: Never Used  Substance Use Topics  . Alcohol use: Yes    Alcohol/week: 0.0 standard drinks    Comment: rare  . Drug use: Never    No current facility-administered medications for this encounter.   Current Outpatient Medications:  .  acetaminophen (TYLENOL) 500 MG tablet, Take 1,000 mg by mouth every 6 (six) hours as needed (for pain.)., Disp: , Rfl:  .  carbamazepine (TEGRETOL XR) 200 MG 12 hr tablet, Take 800 mg by mouth daily. , Disp: , Rfl:  .  ibuprofen (ADVIL,MOTRIN) 200 MG tablet, Take 600 mg by mouth every 8 (eight) hours as needed (for pain.)., Disp: , Rfl:  .  Multiple Vitamin (MULTIVITAMIN WITH MINERALS) TABS tablet, Take 1 tablet by mouth daily., Disp: , Rfl:  .  Baloxavir Marboxil,80 MG Dose, (XOFLUZA) 2 x 40 MG TBPK, Take 80 mg by mouth once for 1 dose., Disp: 2 each, Rfl: 0 .  fluticasone (FLONASE) 50 MCG/ACT nasal spray, Place 2 sprays into both nostrils daily., Disp: 16 g, Rfl: 0 .  oseltamivir (TAMIFLU) 75 MG capsule, Take 1 capsule (75 mg total) by mouth 2 (two) times daily. X 5 days, Disp: 10 capsule, Rfl: 0  Allergies  Allergen Reactions  .  Asa [Aspirin] Swelling    SWELLING REACTION UNSPECIFIED      ROS  As noted in HPI.   Physical Exam  BP 107/74 (BP Location: Right Arm)   Pulse 72   Temp 99 F (37.2 C) (Oral)   Resp 18   Ht 5\' 8"  (1.727 m)   Wt 104.3 kg   SpO2 99%   BMI 34.97 kg/m   Constitutional: Well developed, well nourished, no acute distress Eyes:  EOMI, conjunctiva normal bilaterally HENT: Normocephalic, atraumatic,mucus membranes moist.  Minimal nasal congestion.  Normal turbinates.  No maxillary frontal sinus tenderness.  Erythematous oropharynx, normal tonsils without exudates.  Uvula midline.  No obvious postnasal drip or cobblestoning. Neck: Positive anterior cervical lymphadenopathy.  No  posterior cervical lymphadenopathy. Respiratory: Normal inspiratory effort, lungs clear bilaterally, good air movement Cardiovascular: Normal rate regular rhythm, no murmurs rubs or gallops GI: nondistended skin: No rash, skin intact Musculoskeletal: no deformities Neurologic: Alert & oriented x 3, no focal neuro deficits Psychiatric: Speech and behavior appropriate   ED Course   Medications - No data to display  Orders Placed This Encounter  Procedures  . Rapid Influenza A&B Antigens ([facility] only)    Standing Status:   Standing    Number of Occurrences:   1  . Rapid Strep Screen ([facility] ONLY)    Standing Status:   Standing    Number of Occurrences:   1  . Culture, group A strep    Standing Status:   Standing    Number of Occurrences:   1  . Droplet precaution    Standing Status:   Standing    Number of Occurrences:   1    Results for orders placed or performed during the hospital encounter of 01/26/19 (from the past 24 hour(s))  Rapid Influenza A&B Antigens ([facility] only)     Status: None   Collection Time: 01/26/19 10:12 AM  Result Value Ref Range   Influenza A ([facility]) NEGATIVE NEGATIVE   Influenza B ([facility]) NEGATIVE NEGATIVE  Rapid Strep Screen ([facility] ONLY)     Status: None   Collection Time: 01/26/19 10:12 AM  Result Value Ref Range   Streptococcus, Group A Screen (Direct) NEGATIVE NEGATIVE   No results found.  ED Clinical Impression  Influenza-like illness   ED Assessment/Plan  Rapid flu, strep negative. Doubt covid 19.  However given his exposure to a confirmed case of influenza B, will treat empirically for flu.  Home with Xofluza, if this is too expensive or not available, Tamiflu.  Ibuprofen 600 mg, with 1 g of Tylenol 3 or 4 times a day.  Flonase, saline nasal irrigation.  Follow-up with PMD as needed.  Discussed labs,  MDM, treatment plan, and plan for follow-up with patient. Discussed sn/sx that should prompt return to the ED. patient agrees  with plan.   Meds ordered this encounter  Medications  . Baloxavir Marboxil,80 MG Dose, (XOFLUZA) 2 x 40 MG TBPK    Sig: Take 80 mg by mouth once for 1 dose.    Dispense:  2 each    Refill:  0  . oseltamivir (TAMIFLU) 75 MG capsule    Sig: Take 1 capsule (75 mg total) by mouth 2 (two) times daily. X 5 days    Dispense:  10 capsule    Refill:  0  . fluticasone (FLONASE) 50 MCG/ACT nasal spray    Sig: Place 2 sprays into both nostrils daily.    Dispense:  16  g    Refill:  0    *This clinic note was created using [employer]. Therefore, there may be occasional mistakes despite careful proofreading.   ?   Melynda Ripple, MD 01/26/19 1053

## 2019-01-29 LAB — CULTURE, GROUP A STREP (THRC)

## 2019-02-27 DIAGNOSIS — G40309 Generalized idiopathic epilepsy and epileptic syndromes, not intractable, without status epilepticus: Secondary | ICD-10-CM | POA: Diagnosis not present

## 2019-02-27 DIAGNOSIS — D693 Immune thrombocytopenic purpura: Secondary | ICD-10-CM | POA: Diagnosis not present

## 2019-02-27 DIAGNOSIS — D580 Hereditary spherocytosis: Secondary | ICD-10-CM | POA: Diagnosis not present

## 2019-03-06 DIAGNOSIS — D580 Hereditary spherocytosis: Secondary | ICD-10-CM | POA: Diagnosis not present

## 2019-03-06 DIAGNOSIS — G40309 Generalized idiopathic epilepsy and epileptic syndromes, not intractable, without status epilepticus: Secondary | ICD-10-CM | POA: Diagnosis not present

## 2019-03-06 DIAGNOSIS — Z0000 Encounter for general adult medical examination without abnormal findings: Secondary | ICD-10-CM | POA: Diagnosis not present

## 2019-03-06 DIAGNOSIS — D693 Immune thrombocytopenic purpura: Secondary | ICD-10-CM | POA: Diagnosis not present

## 2019-05-25 ENCOUNTER — Other Ambulatory Visit: Payer: Self-pay

## 2019-05-25 ENCOUNTER — Emergency Department
Admission: EM | Admit: 2019-05-25 | Discharge: 2019-05-25 | Disposition: A | Discharge status: Home / Self Care | Payer: PRIVATE HEALTH INSURANCE | Attending: Emergency Medicine | Admitting: Emergency Medicine

## 2019-05-25 ENCOUNTER — Emergency Department: Payer: PRIVATE HEALTH INSURANCE

## 2019-05-25 DIAGNOSIS — Y929 Unspecified place or not applicable: Secondary | ICD-10-CM | POA: Insufficient documentation

## 2019-05-25 DIAGNOSIS — F17200 Nicotine dependence, unspecified, uncomplicated: Secondary | ICD-10-CM | POA: Insufficient documentation

## 2019-05-25 DIAGNOSIS — W0110XA Fall on same level from slipping, tripping and stumbling with subsequent striking against unspecified object, initial encounter: Secondary | ICD-10-CM | POA: Insufficient documentation

## 2019-05-25 DIAGNOSIS — S63511A Sprain of carpal joint of right wrist, initial encounter: Secondary | ICD-10-CM | POA: Diagnosis not present

## 2019-05-25 DIAGNOSIS — Y939 Activity, unspecified: Secondary | ICD-10-CM | POA: Insufficient documentation

## 2019-05-25 DIAGNOSIS — Y999 Unspecified external cause status: Secondary | ICD-10-CM | POA: Insufficient documentation

## 2019-05-25 DIAGNOSIS — S6981XA Other specified injuries of right wrist, hand and finger(s), initial encounter: Secondary | ICD-10-CM | POA: Diagnosis present

## 2019-05-25 DIAGNOSIS — Z79899 Other long term (current) drug therapy: Secondary | ICD-10-CM | POA: Diagnosis not present

## 2019-05-25 DIAGNOSIS — G40309 Generalized idiopathic epilepsy and epileptic syndromes, not intractable, without status epilepticus: Secondary | ICD-10-CM | POA: Diagnosis not present

## 2019-05-25 MED ORDER — IBUPROFEN 600 MG PO TABS
600.0000 mg | ORAL_TABLET | Freq: Once | ORAL | Status: AC
  Administered 2019-05-25: 11:00:00 600 mg via ORAL
  Filled 2019-05-25: qty 1

## 2019-05-25 MED ORDER — BACITRACIN-NEOMYCIN-POLYMYXIN 400-5-5000 EX OINT
TOPICAL_OINTMENT | Freq: Once | CUTANEOUS | Status: AC
  Administered 2019-05-25: 1 via TOPICAL
  Filled 2019-05-25: qty 1

## 2019-05-25 NOTE — ED Provider Notes (Signed)
[facility] Emergency Department Provider Note   ____________________________________________   First MD Initiated Contact with Patient 05/25/19 0935     (approximate)  I have reviewed the triage vital signs and the nursing notes.   HISTORY  Chief Complaint Wrist Injury    HPI Colton Perez is a 48 y.o. male patient complained of right wrist pain secondary to a trip and fall.  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  Patient also has pain to the right knee.  Patient rates his pain as a 4/10.  Patient described pain is "achy".  Ice pack given in triage.         Past Medical History:  Diagnosis Date  . Blood dyscrasia   . Cytopenia    PSEUDOCYTOPENIA-    LOW   PLATELETS,  H+H    . Seizures ([place])   . Spherocytosis [facility])     Patient Active Problem List   Diagnosis Date Noted  . History of seizures 09/11/2015  . Chronic ITP (idiopathic thrombocytopenia) (H[facility]) 02/05/2015  . Mild anemia 02/05/2015  . 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[place]) 02/05/2015    Past Surgical History:  Procedure Laterality Date  . CHOLECYSTECTOMY N/A 07/28/2018   Procedure: LAPAROSCOPIC CHOLECYSTECTOMY WITH INTRAOPERATIVE CHOLANGIOGRAM ERAS PATHWAY;  Surgeon: Donnie Mesa, MD;  Location: [place];  Service: General;  Laterality: N/A;  . CYST EXCISION PERINEAL N/A 07/28/2018   Procedure: CYST EXCISION PERINEAL;  Surgeon: Donnie Mesa, MD;  Location: [place];  Service: General;  Laterality: N/A;  . [place]  . TESTICULAR EXPLORATION  1978  . WISDOM TOOTH EXTRACTION      Prior to Admission medications   Medication Sig Start Date End Date Taking? Authorizing Provider  acetaminophen (TYLENOL) 500 MG tablet Take 1,000 mg by mouth every 6 (six) hours as needed (for pain.).    [provider]  carbamazepine (TEGRETOL XR) 200 MG 12 hr tablet Take 800 mg by mouth daily.      [provider]  fluticasone (FLONASE) 50 MCG/ACT nasal spray Place 2 sprays into both nostrils daily. 01/26/19   Melynda Ripple, MD  ibuprofen (ADVIL,MOTRIN) 200 MG tablet Take 600 mg by mouth every 8 (eight) hours as needed (for pain.).    [provider]  Multiple Vitamin (MULTIVITAMIN WITH MINERALS) TABS tablet Take 1 tablet by mouth daily.    [provider]    Allergies Asa [aspirin]  Family History  Problem Relation Age of Onset  . Hypertension Mother   . Hyperlipidemia Mother   . Asthma Mother   . Cancer Maternal Grandmother        pancreatic    Social History Social History   Tobacco Use  . Smoking status: Current Every Day Smoker    Packs/day: 1.00    Years: 30.00    Pack years: 30.00  . Smokeless tobacco: Never Used  Substance Use Topics  . Alcohol use: Yes    Alcohol/week: 0.0 standard drinks    Comment: rare  . Drug use: Never    Review of Systems Constitutional: No fever/chills Eyes: No visual changes. ENT: No sore throat. Cardiovascular: Denies chest pain. Respiratory: Denies shortness of breath. Gastrointestinal: No abdominal pain.  No nausea, no vomiting.  No diarrhea.  No constipation. Genitourinary: Negative for dysuria. Musculoskeletal: Positive for right wrist and knee pain.   Skin: Negative for rash.  Abrasion right knee Neurological: Negative for headaches, focal weakness or  numbness.  Hematological/Lymphatic:  ITP Allergic/Immunilogical: ASA  ____________________________________________   PHYSICAL EXAM:  VITAL SIGNS: ED Triage Vitals  Enc Vitals Group     BP 05/25/19 0930 132/81     Pulse Rate 05/25/19 0930 68     Resp 05/25/19 0930 17     Temp 05/25/19 0930 98.3 F (36.8 C)     Temp Source 05/25/19 0930 Oral     SpO2 05/25/19 0930 99 %     Weight 05/25/19 0932 235 lb (106.6 kg)     Height 05/25/19 0932 5\' 8"  (1.727 m)     Head Circumference --      Peak Flow --      Pain Score 05/25/19 0932 4      Pain Loc --      Pain Edu? --      Excl. in [place]? --     Constitutional: Alert and oriented. Well appearing and in no acute distress. Cardiovascular: Normal rate, regular rhythm. Grossly normal heart sounds.  Good peripheral circulation. Respiratory: Normal respiratory effort.  No retractions. Lungs CTAB. Musculoskeletal: No obvious deformity to the right wrist.  Patient is moderate guarding palpation the distal radius.  Neurologic:  Normal speech and language. No gross focal neurologic deficits are appreciated. No gait instability. Skin:  Skin is warm, dry and intact. No rash noted.  Abrasion anterior right patella. Psychiatric: Mood and affect are normal. Speech and behavior are normal.  ____________________________________________   LABS (all labs ordered are listed, but only abnormal results are displayed)  Labs Reviewed - No data to display ____________________________________________  EKG   ____________________________________________  RADIOLOGY  ED MD interpretation:    Official radiology report(s): Dg Wrist Complete Right  Result Date: 05/25/2019 CLINICAL DATA:  Pain after fall EXAM: RIGHT WRIST - COMPLETE 3+ VIEW COMPARISON:  None. FINDINGS: There is no evidence of fracture or dislocation. There is no evidence of arthropathy or other focal bone abnormality. Soft tissues are unremarkable. IMPRESSION: Negative. Electronically Signed   By: Dorise Bullion III M.D   On: 05/25/2019 10:15    ____________________________________________   PROCEDURES  Procedure(s) performed (including Critical Care):  Procedures   ____________________________________________   INITIAL IMPRESSION / ASSESSMENT AND PLAN / ED COURSE  As part of my medical decision making, I reviewed the following data within the [place] was evaluated in Emergency Department on 05/25/2019 for the symptoms described in the history of present illness. He was  evaluated in the context of the global COVID-19 pandemic, which necessitated consideration that the patient might be at risk for infection with the SARS-CoV-2 virus that causes COVID-19. Institutional protocols and algorithms that pertain to the evaluation of patients at risk for COVID-19 are in a state of rapid change based on information released by regulatory bodies including the CDC and federal and state organizations. These policies and algorithms were followed during the patient's care in the ED.   Patient presents with right wrist and knee pain secondary to fall.  Patient's wrist will be evaluated further with x-ray. Discussed x-ray findings with patient.  Patient placed in a wrist splint given discharge care instructions and a work note.  Patient advised to follow-up if no improvement in 2 to 3 days.      ____________________________________________   FINAL CLINICAL IMPRESSION(S) / ED DIAGNOSES  Final diagnoses:  Sprain of carpal joint of right wrist, initial encounter     ED Discharge Orders  None       Note:  This document was prepared using Dragon voice recognition software and may include unintentional dictation errors.    Sable Feil, PA-C 05/25/19 1025    Delman Kitten, MD 06/23/19 1028

## 2019-05-25 NOTE — ED Triage Notes (Signed)
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

## 2019-05-25 NOTE — ED Notes (Signed)
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

## 2019-05-25 NOTE — Discharge Instructions (Signed)
Follow discharge care instruction wear wrist splint for 2 to 3 days as needed.

## 2019-05-29 DIAGNOSIS — S63501A Unspecified sprain of right wrist, initial encounter: Secondary | ICD-10-CM | POA: Diagnosis not present

## 2019-07-06 IMAGING — CT CT HEAD W/O CM
3 series · 15 of 47 positions shown, 18 images · non-contrast
Comparison: None.

CLINICAL DATA: Headache.

EXAM:
CT HEAD WITHOUT CONTRAST
TECHNIQUE: Contiguous axial images were obtained from the base of the skull
through the vertex without intravenous contrast.

[Series 2: head wo · axial · 0.47mm/px · z∈[-57,+68]mm · 9 of 31 slices shown, 12 images]
[im 3/31  brain]
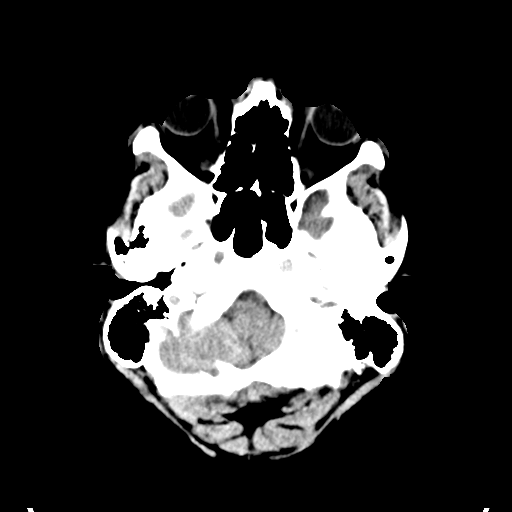
[im 3/31  bone]
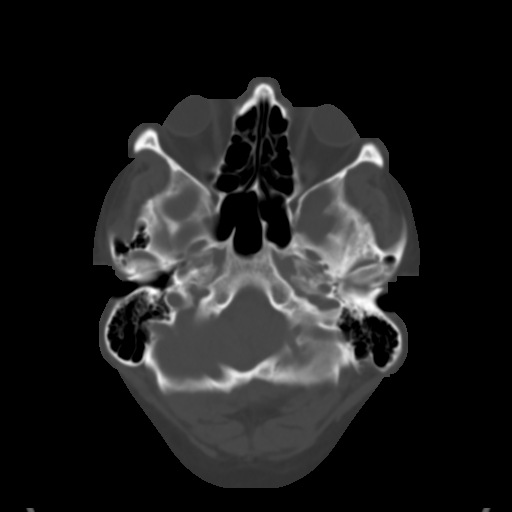
[im 6/31  brain]
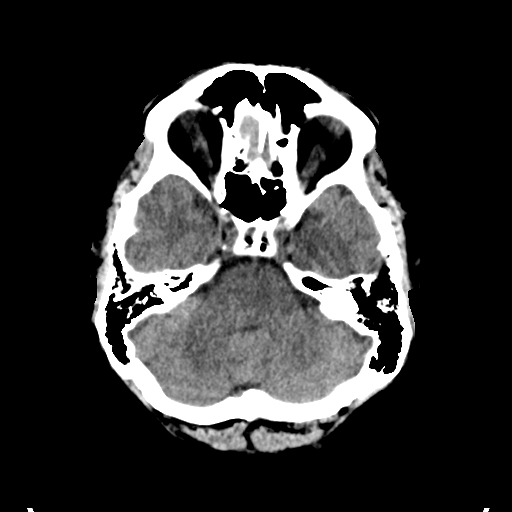
[im 9/31  brain]
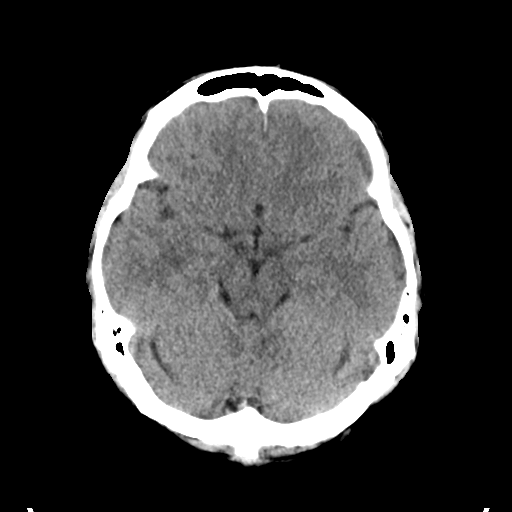
[im 12/31  brain]
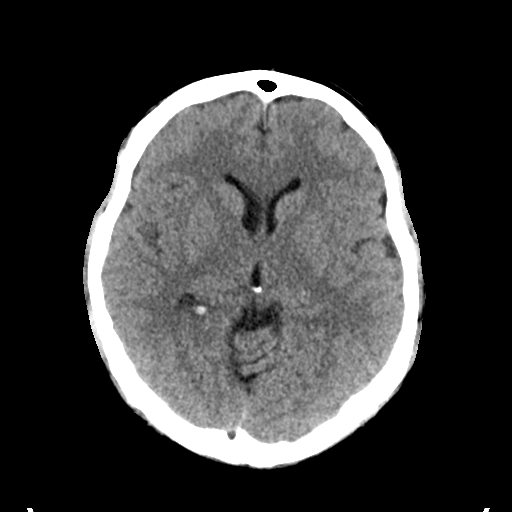
[im 16/31  brain]
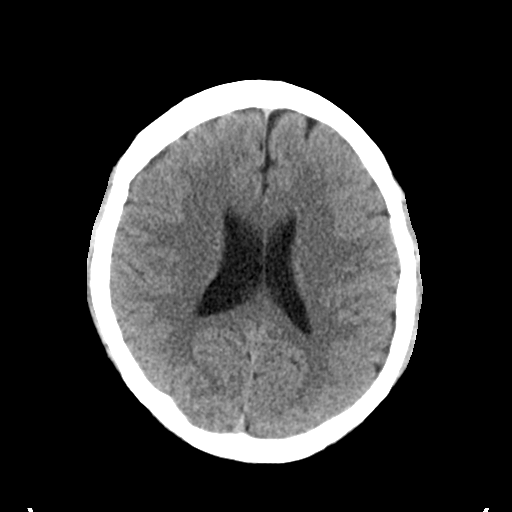
[im 16/31  bone]
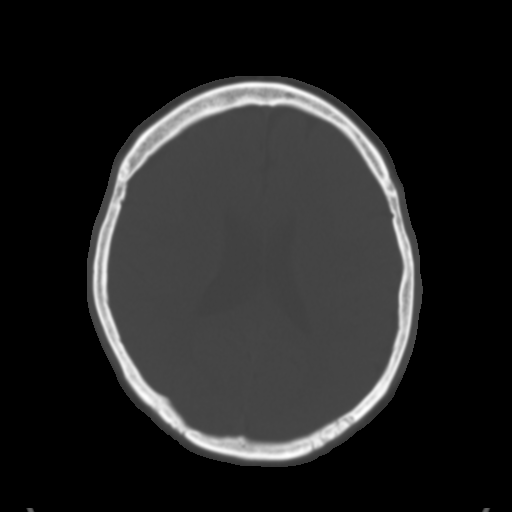
[im 19/31  brain]
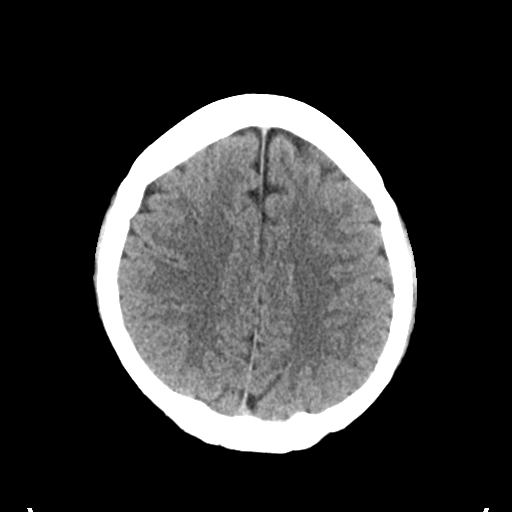
[im 22/31  brain]
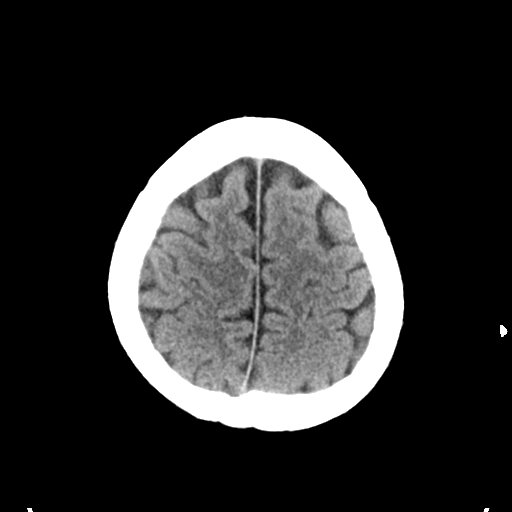
[im 25/31  brain]
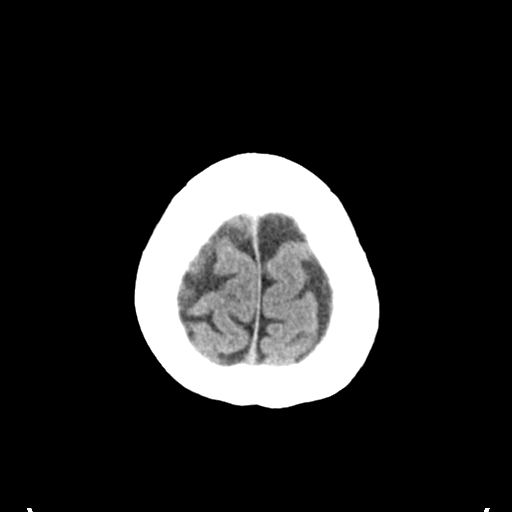
[im 28/31  brain]
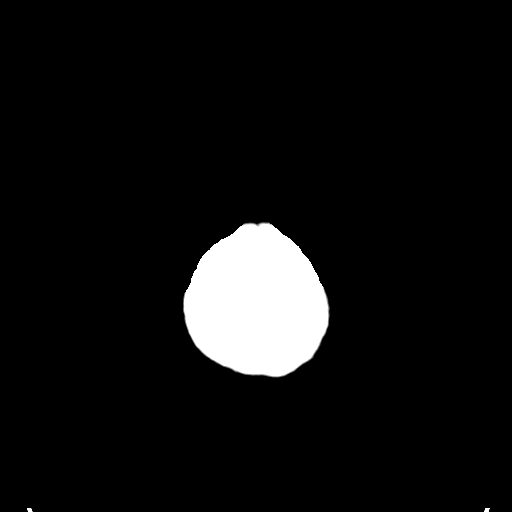
[im 28/31  bone]
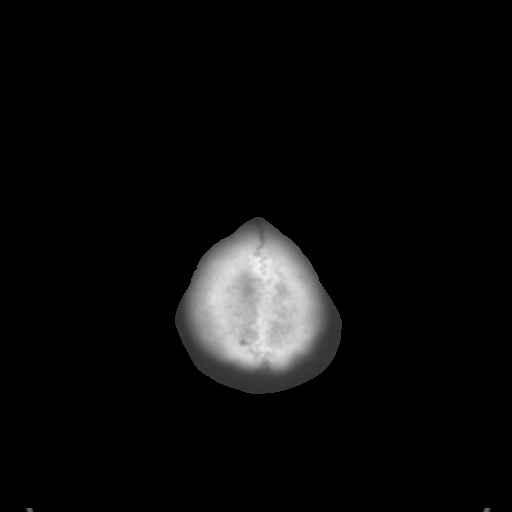

[Series 4: coronal soft tissue · coronal · 0.30mm/px · 3 of 62 slices shown]
[im 21/62  brain]
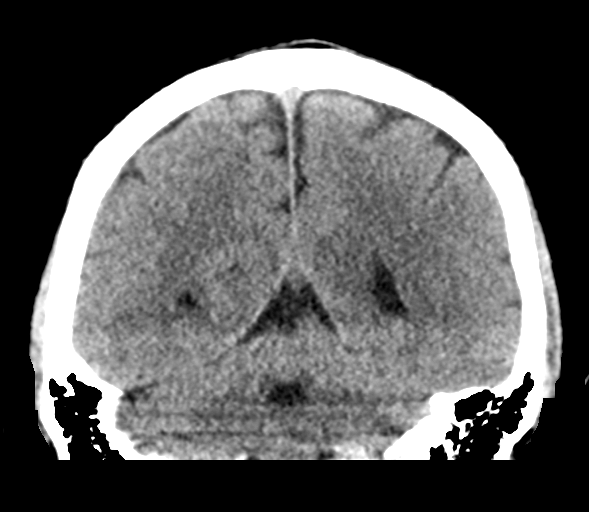
[im 28/62  brain]
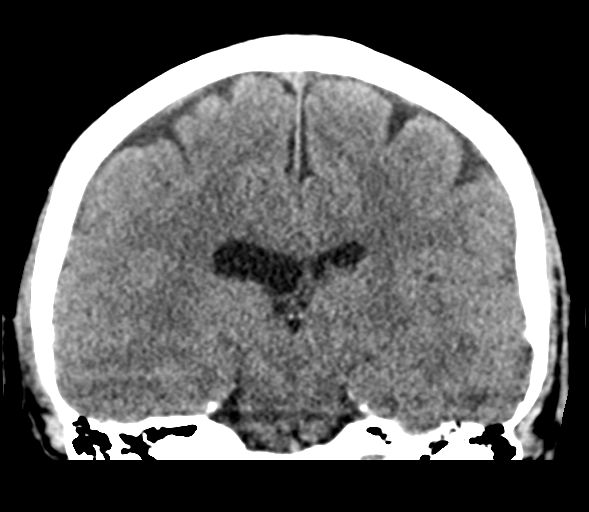
[im 34/62  brain]
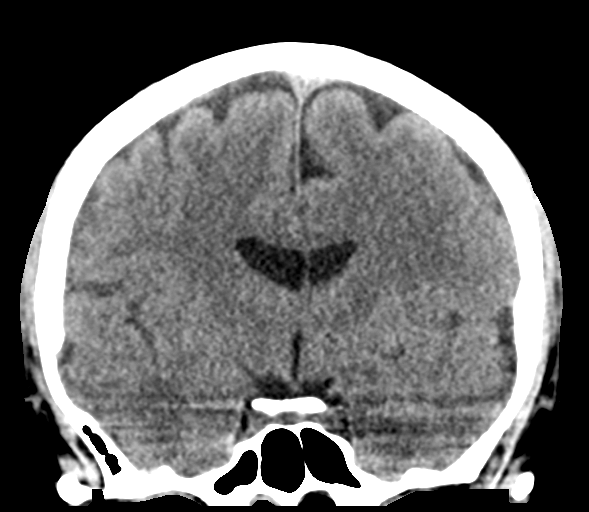

[Series 5: sagittal soft tissue · sagittal · 0.30mm/px · 3 of 56 slices shown]
[im 19/56  brain]
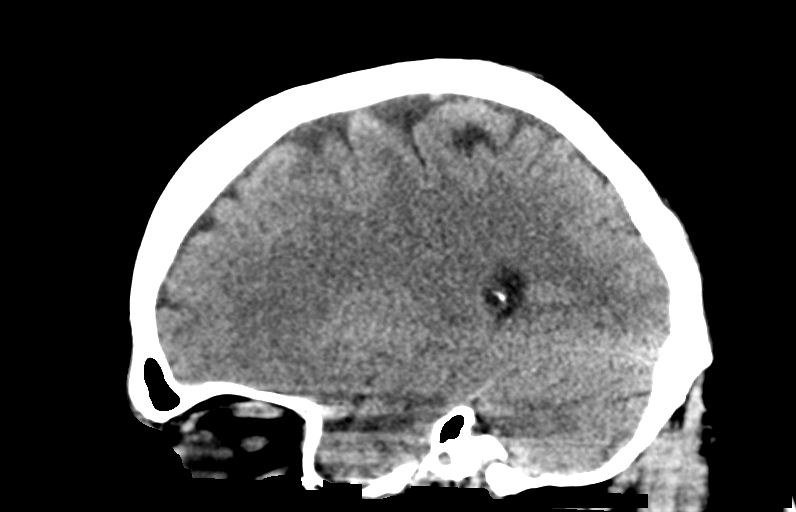
[im 28/56  brain]
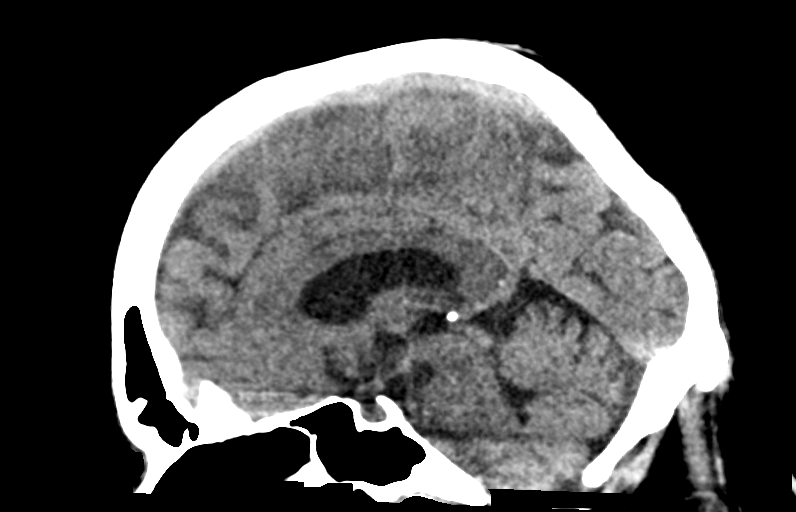
[im 37/56  brain]
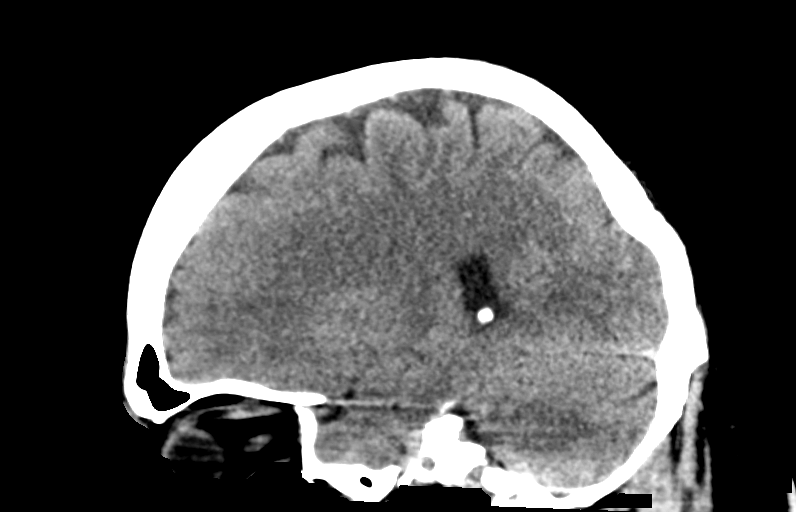

[15 of 47 positions shown; findings below may reference images not displayed]

FINDINGS: Brain: No evidence of acute infarction, hemorrhage, hydrocephalus,
extra-axial collection or mass lesion/mass effect. Slight asymmetry
of the lateral ventricles.

Vascular: No hyperdense vessel or unexpected calcification.

Skull: Normal. Negative for fracture or focal lesion.

Sinuses/Orbits: The bilateral paranasal sinuses and mastoid air
cells are clear. The orbits are unremarkable.

Other: None.
IMPRESSION: 1.  No acute intracranial abnormality.

## 2019-12-14 DIAGNOSIS — R569 Unspecified convulsions: Secondary | ICD-10-CM | POA: Diagnosis not present

## 2020-01-14 DIAGNOSIS — R569 Unspecified convulsions: Secondary | ICD-10-CM | POA: Diagnosis not present

## 2020-02-15 DIAGNOSIS — R569 Unspecified convulsions: Secondary | ICD-10-CM | POA: Diagnosis not present

## 2020-02-28 DIAGNOSIS — G40309 Generalized idiopathic epilepsy and epileptic syndromes, not intractable, without status epilepticus: Secondary | ICD-10-CM | POA: Diagnosis not present

## 2020-02-28 DIAGNOSIS — Z0000 Encounter for general adult medical examination without abnormal findings: Secondary | ICD-10-CM | POA: Diagnosis not present

## 2020-02-28 DIAGNOSIS — D693 Immune thrombocytopenic purpura: Secondary | ICD-10-CM | POA: Diagnosis not present

## 2020-03-06 DIAGNOSIS — D580 Hereditary spherocytosis: Secondary | ICD-10-CM | POA: Diagnosis not present

## 2020-03-06 DIAGNOSIS — R569 Unspecified convulsions: Secondary | ICD-10-CM | POA: Diagnosis not present

## 2020-03-06 DIAGNOSIS — Z0000 Encounter for general adult medical examination without abnormal findings: Secondary | ICD-10-CM | POA: Diagnosis not present

## 2020-03-06 DIAGNOSIS — D693 Immune thrombocytopenic purpura: Secondary | ICD-10-CM | POA: Diagnosis not present

## 2020-03-29 DIAGNOSIS — F458 Other somatoform disorders: Secondary | ICD-10-CM | POA: Diagnosis not present

## 2020-03-29 DIAGNOSIS — R490 Dysphonia: Secondary | ICD-10-CM | POA: Diagnosis not present

## 2020-03-29 DIAGNOSIS — K219 Gastro-esophageal reflux disease without esophagitis: Secondary | ICD-10-CM | POA: Diagnosis not present

## 2020-04-01 DIAGNOSIS — Z23 Encounter for immunization: Secondary | ICD-10-CM | POA: Diagnosis not present

## 2020-04-01 DIAGNOSIS — S91331A Puncture wound without foreign body, right foot, initial encounter: Secondary | ICD-10-CM | POA: Diagnosis not present

## 2020-04-24 DIAGNOSIS — R569 Unspecified convulsions: Secondary | ICD-10-CM | POA: Diagnosis not present

## 2020-05-21 DIAGNOSIS — R569 Unspecified convulsions: Secondary | ICD-10-CM | POA: Diagnosis not present

## 2020-08-01 ENCOUNTER — Other Ambulatory Visit: Payer: Self-pay

## 2020-08-01 ENCOUNTER — Ambulatory Visit: Payer: 59 | Admitting: Dermatology

## 2020-08-01 DIAGNOSIS — D229 Melanocytic nevi, unspecified: Secondary | ICD-10-CM

## 2020-08-01 DIAGNOSIS — D225 Melanocytic nevi of trunk: Secondary | ICD-10-CM

## 2020-08-01 DIAGNOSIS — L308 Other specified dermatitis: Secondary | ICD-10-CM | POA: Diagnosis not present

## 2020-08-01 DIAGNOSIS — D1800 Hemangioma unspecified site: Secondary | ICD-10-CM

## 2020-08-01 DIAGNOSIS — D2371 Other benign neoplasm of skin of right lower limb, including hip: Secondary | ICD-10-CM | POA: Diagnosis not present

## 2020-08-01 DIAGNOSIS — Z1283 Encounter for screening for malignant neoplasm of skin: Secondary | ICD-10-CM

## 2020-08-01 DIAGNOSIS — D239 Other benign neoplasm of skin, unspecified: Secondary | ICD-10-CM

## 2020-08-01 DIAGNOSIS — L821 Other seborrheic keratosis: Secondary | ICD-10-CM | POA: Diagnosis not present

## 2020-08-01 DIAGNOSIS — L578 Other skin changes due to chronic exposure to nonionizing radiation: Secondary | ICD-10-CM | POA: Diagnosis not present

## 2020-08-01 DIAGNOSIS — L813 Cafe au lait spots: Secondary | ICD-10-CM

## 2020-08-01 DIAGNOSIS — L814 Other melanin hyperpigmentation: Secondary | ICD-10-CM

## 2020-08-01 DIAGNOSIS — D485 Neoplasm of uncertain behavior of skin: Secondary | ICD-10-CM

## 2020-08-01 HISTORY — DX: Other benign neoplasm of skin, unspecified: D23.9

## 2020-08-01 NOTE — Progress Notes (Signed)
New Patient Visit  Subjective  Colton Perez is a 49 y.o. male who presents for the following: Skin Cancer Screening.  New patient here today for full body skin exam and skin cancer screening. He has no history of skin cancer and no history in immediate family. His stepfather just passed away from melanoma. There is a spot at left temple that's been there for a few years, has recently started getting irritated.   He has a history of intermittent rash at the hands which is very itchy and looks like little dots that get brown.  It tends to come up in summer.  He does not have any medication to treat this.  The following portions of the chart were reviewed this encounter and updated as appropriate:  Tobacco  Allergies  Meds  Problems  Med Hx  Surg Hx  Fam Hx      Review of Systems:  No other skin or systemic complaints except as noted in HPI or Assessment and Plan.  Objective  Well appearing patient in no apparent distress; mood and affect are within normal limits.  A full examination was performed including scalp, head, eyes, ears, nose, lips, neck, chest, axillae, abdomen, back, buttocks, bilateral upper extremities, bilateral lower extremities, hands, feet, fingers, toes, fingernails, and toenails. All findings within normal limits unless otherwise noted below.  Objective  Right Medial Knee: Firm pink/brown papulenodule with dimple sign.   Objective  Low Back Left of Midline: 0.6cm slightly irregular brown papule R/o Atypia  Objective  Hands: Clear today   Assessment & Plan  Dermatofibroma Right Medial Knee  Benign-appearing.  Observation.  Call clinic for new or changing lesions.  Recommend daily use of broad spectrum spf 30+ sunscreen to sun-exposed areas.  Neoplasm of uncertain behavior of skin Low Back Left of Midline  Epidermal / dermal shaving  Lesion diameter (cm):  0.6 Informed consent: discussed and consent obtained   Timeout: patient name, date of  birth, surgical site, and procedure verified   Patient was prepped and draped in usual sterile fashion: area prepped with isopropyl alcohol. Anesthesia: the lesion was anesthetized in a standard fashion   Anesthetic:  1% lidocaine w/ epinephrine 1-100,000 buffered w/ 8.4% NaHCO3 Instrument used: flexible razor blade   Hemostasis achieved with: aluminum chloride   Outcome: patient tolerated procedure well   Post-procedure details: wound care instructions given   Additional details:  Mupirocin and a bandage applied  Specimen 1 - Surgical pathology Differential Diagnosis:  Check Margins: No 0.6cm slightly irregular brown papule R/o Atypia  Other eczema Hands  Dyshidrotic, history of  Chronic, recurrent  Patient defers treatment today, will call if he decides he wants something sent in.   If he calls and requests treatment for this, clobetasol 0.05% ointment can be sent in for patient to use twice daily as needed for rash/eczema. Avoid applying to face, groin, and axilla. Use as directed. Risk of skin atrophy with long-term use reviewed.      Lentigines - Scattered tan macules - Discussed due to sun exposure - Benign, observe - Call for any changes  Seborrheic Keratoses - Stuck-on, waxy, tan-brown papules and plaques  - Discussed benign etiology and prognosis. - Observe - Call for any changes  Melanocytic Nevi - Tan-brown and/or pink-flesh-colored symmetric macules and papules - Benign appearing on exam today - Observation - Call clinic for new or changing moles - Recommend daily use of broad spectrum spf 30+ sunscreen to sun-exposed areas.   Hemangiomas -  Red papules - Discussed benign nature - Observe - Call for any changes  Actinic Damage - diffuse scaly erythematous macules with underlying dyspigmentation - Recommend daily broad spectrum sunscreen SPF 30+ to sun-exposed areas, reapply every 2 hours as needed.  - Call for new or changing lesions.  Skin cancer  screening performed today.  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  Graciella Belton, RMA, am acting as scribe for Forest Gleason, MD .  Documentation: I have reviewed the above documentation for accuracy and completeness, and I agree with the above.  Forest Gleason, MD

## 2020-08-01 NOTE — Patient Instructions (Addendum)
Melanoma ABCDEs  Melanoma is the most dangerous type of skin cancer, and is the leading cause of death from skin disease.  You are more likely to develop melanoma if you:  Have light-colored skin, light-colored eyes, or red or blond hair  Spend a lot of time in the sun  Tan regularly, either outdoors or in a tanning bed  Have had blistering sunburns, especially during childhood  Have a close family member who has had a melanoma  Have atypical moles or large birthmarks  Early detection of melanoma is key since treatment is typically straightforward and cure rates are extremely high if we catch it early.   The first sign of melanoma is often a change in a mole or a new dark spot.  The ABCDE system is a way of remembering the signs of melanoma.  A for asymmetry:  The two halves do not match. B for border:  The edges of the growth are irregular. C for color:  A mixture of colors are present instead of an even brown color. D for diameter:  Melanomas are usually (but not always) greater than 33mm - the size of a pencil eraser. E for evolution:  The spot keeps changing in size, shape, and color.  Please check your skin once per month between visits. You can use a small mirror in front and a large mirror behind you to keep an eye on the back side or your body.   If you see any new or changing lesions before your next follow-up, please call to schedule a visit.  Please continue daily skin protection including broad spectrum sunscreen SPF 30+ to sun-exposed areas, reapplying every 2 hours as needed when you're outdoors.   Recommend taking Heliocare sun protection supplement daily in sunny weather for additional sun protection. For maximum protection on the sunniest days, you can take up to 2 capsules of regular Heliocare OR take 1 capsule of Heliocare Ultra. For prolonged exposure (such as a full day in the sun), you can repeat your dose of the supplement 4 hours after your first dose. Heliocare  can be purchased at [facility] or at [email or web address].   Consider adding Vitamin D 600-800iu daily.   Wound Care Instructions  1. Cleanse wound gently with soap and water once a day then pat dry with clean gauze. Apply a thing coat of Petrolatum (petroleum jelly, "Vaseline") over the wound (unless you have an allergy to this). We recommend that you use a new, sterile tube of Vaseline. Do not pick or remove scabs. Do not remove the yellow or white "healing tissue" from the base of the wound.  2. Cover the wound with fresh, clean, nonstick gauze and secure with paper tape. You may use Band-Aids in place of gauze and tape if the would is small enough, but would recommend trimming much of the tape off as there is often too much. Sometimes Band-Aids can irritate the skin.  3. You should call the office for your biopsy report after 1 week if you have not already been contacted.  4. If you experience any problems, such as abnormal amounts of bleeding, swelling, significant bruising, significant pain, or evidence of infection, please call the office immediately.  5. FOR ADULT SURGERY PATIENTS: If you need something for pain relief you may take 1 extra strength Tylenol (acetaminophen) AND 2 Ibuprofen (200mg  each) together every 4 hours as needed for pain. (do not take these if you are allergic to them or if you have  a reason you should not take them.) Typically, you may only need pain medication for 1 to 3 days.   Topical steroids (such as triamcinolone, fluocinolone, fluocinonide, mometasone, clobetasol, halobetasol, betamethasone, hydrocortisone) can cause thinning and lightening of the skin if they are used for too long in the same area. Your physician has selected the right strength medicine for your problem and area affected on the body. Please use your medication only as directed by your physician to prevent side effects.

## 2020-08-06 ENCOUNTER — Encounter: Payer: Self-pay | Admitting: Dermatology

## 2020-08-06 NOTE — Progress Notes (Signed)
Skin , low back left of midline DYSPLASTIC COMPOUND NEVUS WITH MODERATE ATYPIA, CLOSE TO MARGIN  This is a MODERATELY ATYPICAL MOLE. On the spectrum from normal mole to melanoma skin cancer, this is in between the two. - We need to recheck this area sometime in the next 6 months to be sure there is no evidence of the atypical mole coming back. If there is any color coming back, we would recommend repeating the biopsy to be sure the cells look normal.  - People who have a history of atypical moles do have a slightly increased risk of developing melanoma somewhere on the body, so a yearly full body skin exam by a dermatologist is recommended.  - Monthly self skin checks and daily sun protection are also recommended.  - Please call if you notice a dark spot coming back where this biopsy was taken.  - Please also call if you notice any new or changing spots anywhere else on the body before your follow-up visit.    MAs please call

## 2020-08-07 ENCOUNTER — Telehealth: Payer: Self-pay

## 2020-08-07 NOTE — Telephone Encounter (Signed)
Patient called and informed of biopsy results, patient verbalized understanding. Patient has a return appointment on 02/06/21 @ 9am

## 2020-08-19 ENCOUNTER — Other Ambulatory Visit: Payer: Self-pay | Admitting: Neurology

## 2021-02-06 ENCOUNTER — Encounter: Payer: Self-pay | Admitting: Dermatology

## 2021-02-06 ENCOUNTER — Other Ambulatory Visit: Payer: Self-pay

## 2021-02-06 ENCOUNTER — Ambulatory Visit: Payer: 59 | Admitting: Dermatology

## 2021-02-06 DIAGNOSIS — Z86018 Personal history of other benign neoplasm: Secondary | ICD-10-CM | POA: Diagnosis not present

## 2021-02-06 DIAGNOSIS — L578 Other skin changes due to chronic exposure to nonionizing radiation: Secondary | ICD-10-CM | POA: Diagnosis not present

## 2021-02-06 DIAGNOSIS — L820 Inflamed seborrheic keratosis: Secondary | ICD-10-CM

## 2021-02-06 DIAGNOSIS — D225 Melanocytic nevi of trunk: Secondary | ICD-10-CM | POA: Diagnosis not present

## 2021-02-06 DIAGNOSIS — D485 Neoplasm of uncertain behavior of skin: Secondary | ICD-10-CM

## 2021-02-06 NOTE — Patient Instructions (Addendum)
Cryotherapy Aftercare  . Wash gently with soap and water everyday.   Marland Kitchen Apply Vaseline and Band-Aid daily until healed.  Prior to procedure, discussed risks of blister formation, small wound, skin dyspigmentation, or rare scar following cryotherapy.    Recommend taking Heliocare sun protection supplement daily in sunny weather for additional sun protection. For maximum protection on the sunniest days, you can take up to 2 capsules of regular Heliocare OR take 1 capsule of Heliocare Ultra. For prolonged exposure (such as a full day in the sun), you can repeat your dose of the supplement 4 hours after your first dose. Heliocare can be purchased at [facility] or at [email or web address].    For stitches - put in 2 simple interrupted and 1 central vertical mattress

## 2021-02-06 NOTE — Progress Notes (Signed)
   Follow-Up Visit   Subjective  Colton Perez is a 50 y.o. male who presents for the following: Follow-up (Patient here for 6 month recheck bx proven moderately atypical nevus at low back left of midline. Patient does have a spot at left temple that has become irritated. ).  The following portions of the chart were reviewed this encounter and updated as appropriate:   Tobacco  Allergies  Meds  Problems  Med Hx  Surg Hx  Fam Hx      Review of Systems:  No other skin or systemic complaints except as noted in HPI or Assessment and Plan.  Objective  Well appearing patient in no apparent distress; mood and affect are within normal limits.  A focused examination was performed including face, back. Relevant physical exam findings are noted in the Assessment and Plan.  Objective  left temple: Erythematous keratotic or waxy stuck-on papule or plaque.   Objective  Lower Back left of midline: 0.2cm brown macule within scar   Assessment & Plan  Inflamed seborrheic keratosis left temple  Symptomatic, catches on hats Prior to procedure, discussed risks of blister formation, small wound, skin dyspigmentation, or rare scar following cryotherapy.    Destruction of lesion - left temple  Destruction method: cryotherapy   Informed consent: discussed and consent obtained   Lesion destroyed using liquid nitrogen: Yes   Cryotherapy cycles:  2 Outcome: patient tolerated procedure well with no complications   Post-procedure details: wound care instructions given    Neoplasm of uncertain behavior of skin Lower Back left of midline  Skin / nail biopsy Type of biopsy: punch   Informed consent: discussed and consent obtained   Timeout: patient name, date of birth, surgical site, and procedure verified   Patient was prepped and draped in usual sterile fashion: Area prepped with isopropyl alcohol. Anesthesia: the lesion was anesthetized in a standard fashion   Anesthetic:  1% lidocaine  w/ epinephrine 1-100,000 buffered w/ 8.4% NaHCO3 Punch size:  4 mm Suture size:  4-0 Suture type: Prolene (polypropylene)   Hemostasis achieved with: suture and aluminum chloride   Outcome: patient tolerated procedure well   Post-procedure details: wound care instructions given   Additional details:  Mupirocin and a dressing applied  Specimen 1 - Surgical pathology Differential Diagnosis: r/o recurrent atypical nevus  Check Margins: No 0.2cm brown macule within scar [id number]  Actinic Damage - chronic, secondary to cumulative UV radiation exposure/sun exposure over time - diffuse scaly erythematous macules with underlying dyspigmentation - Recommend daily broad spectrum sunscreen SPF 30+ to sun-exposed areas, reapply every 2 hours as needed.  - Recommend staying in the shade or wearing long sleeves, sun glasses (UVA+UVB protection) and wide brim hats (4-inch brim around the entire circumference of the hat). - Call for new or changing lesions.  Return for TBSE as scheduled.  Graciella Belton, RMA, am acting as scribe for Forest Gleason, MD .  Documentation: I have reviewed the above documentation for accuracy and completeness, and I agree with the above.  Forest Gleason, MD

## 2021-02-11 ENCOUNTER — Other Ambulatory Visit: Payer: Self-pay

## 2021-02-11 MED ORDER — ESOMEPRAZOLE MAGNESIUM 40 MG PO CPDR
40.0000 mg | DELAYED_RELEASE_CAPSULE | Freq: Every day | ORAL | 10 refills | Status: DC
  Filled 2021-02-11: qty 30, 30d supply, fill #0
  Filled 2021-03-30: qty 30, 30d supply, fill #1

## 2021-03-04 DIAGNOSIS — Z0000 Encounter for general adult medical examination without abnormal findings: Secondary | ICD-10-CM | POA: Diagnosis not present

## 2021-03-04 DIAGNOSIS — D693 Immune thrombocytopenic purpura: Secondary | ICD-10-CM | POA: Diagnosis not present

## 2021-03-04 DIAGNOSIS — R569 Unspecified convulsions: Secondary | ICD-10-CM | POA: Diagnosis not present

## 2021-03-11 DIAGNOSIS — Z0000 Encounter for general adult medical examination without abnormal findings: Secondary | ICD-10-CM | POA: Diagnosis not present

## 2021-03-11 DIAGNOSIS — R0602 Shortness of breath: Secondary | ICD-10-CM | POA: Diagnosis not present

## 2021-03-11 DIAGNOSIS — D580 Hereditary spherocytosis: Secondary | ICD-10-CM | POA: Diagnosis not present

## 2021-03-11 DIAGNOSIS — G40309 Generalized idiopathic epilepsy and epileptic syndromes, not intractable, without status epilepticus: Secondary | ICD-10-CM | POA: Diagnosis not present

## 2021-03-11 DIAGNOSIS — D693 Immune thrombocytopenic purpura: Secondary | ICD-10-CM | POA: Diagnosis not present

## 2021-03-31 ENCOUNTER — Other Ambulatory Visit: Payer: Self-pay

## 2021-04-24 ENCOUNTER — Other Ambulatory Visit: Payer: Self-pay

## 2021-04-24 MED FILL — Lamotrigine Tab ER 24HR 50 MG: ORAL | 90 days supply | Qty: 90 | Fill #0 | Status: AC

## 2021-04-24 MED FILL — Lamotrigine Tab ER 24HR 100 MG: ORAL | 90 days supply | Qty: 90 | Fill #0 | Status: AC

## 2021-04-24 MED FILL — Lamotrigine Tab ER 24HR 200 MG: ORAL | 90 days supply | Qty: 90 | Fill #0 | Status: AC

## 2021-04-25 ENCOUNTER — Other Ambulatory Visit: Payer: Self-pay

## 2021-05-21 ENCOUNTER — Other Ambulatory Visit: Payer: Self-pay

## 2021-05-21 DIAGNOSIS — R569 Unspecified convulsions: Secondary | ICD-10-CM | POA: Diagnosis not present

## 2021-05-21 DIAGNOSIS — M545 Low back pain, unspecified: Secondary | ICD-10-CM | POA: Diagnosis not present

## 2021-05-21 MED ORDER — GABAPENTIN 100 MG PO CAPS
ORAL_CAPSULE | ORAL | 3 refills | Status: DC
  Filled 2021-05-21: qty 120, 30d supply, fill #0
  Filled 2021-08-22: qty 120, 30d supply, fill #1
  Filled 2021-11-20: qty 120, 30d supply, fill #2

## 2021-07-01 ENCOUNTER — Other Ambulatory Visit: Payer: Self-pay

## 2021-07-01 MED ORDER — PEG 3350-KCL-NA BICARB-NACL 420 G PO SOLR
ORAL | 0 refills | Status: DC
  Filled 2021-07-01: qty 4000, 1d supply, fill #0

## 2021-07-10 ENCOUNTER — Other Ambulatory Visit: Payer: Self-pay

## 2021-08-07 ENCOUNTER — Encounter: Payer: Self-pay | Admitting: Dermatology

## 2021-08-07 ENCOUNTER — Ambulatory Visit: Payer: 59 | Admitting: Dermatology

## 2021-08-07 ENCOUNTER — Other Ambulatory Visit: Payer: Self-pay

## 2021-08-07 DIAGNOSIS — L821 Other seborrheic keratosis: Secondary | ICD-10-CM

## 2021-08-07 DIAGNOSIS — D229 Melanocytic nevi, unspecified: Secondary | ICD-10-CM | POA: Diagnosis not present

## 2021-08-07 DIAGNOSIS — Z1283 Encounter for screening for malignant neoplasm of skin: Secondary | ICD-10-CM

## 2021-08-07 DIAGNOSIS — Z86018 Personal history of other benign neoplasm: Secondary | ICD-10-CM

## 2021-08-07 DIAGNOSIS — L814 Other melanin hyperpigmentation: Secondary | ICD-10-CM | POA: Diagnosis not present

## 2021-08-07 DIAGNOSIS — L578 Other skin changes due to chronic exposure to nonionizing radiation: Secondary | ICD-10-CM

## 2021-08-07 DIAGNOSIS — D1800 Hemangioma unspecified site: Secondary | ICD-10-CM

## 2021-08-07 NOTE — Progress Notes (Signed)
   Follow-Up Visit   Subjective  Colton Perez is a 50 y.o. male who presents for the following: TSBE (Total body exam today. Hx of moderate dysplastic nevus in 2021. Nothing new or changing that he has noticed. ).  Patient here for full body skin exam and skin cancer screening.  The following portions of the chart were reviewed this encounter and updated as appropriate:  Tobacco  Allergies  Meds  Problems  Med Hx  Surg Hx  Fam Hx      Review of Systems: No other skin or systemic complaints except as noted in HPI or Assessment and Plan.   Objective  Well appearing patient in no apparent distress; mood and affect are within normal limits.  A full examination was performed including scalp, head, eyes, ears, nose, lips, neck, chest, axillae, abdomen, back, buttocks, bilateral upper extremities, bilateral lower extremities, hands, feet, fingers, toes, fingernails, and toenails. All findings within normal limits unless otherwise noted below.   Assessment & Plan  Lentigines - Scattered tan macules - Due to sun exposure - Benign-appearing, observe - Recommend daily broad spectrum sunscreen SPF 30+ to sun-exposed areas, reapply every 2 hours as needed. - Call for any changes  Seborrheic Keratoses - Stuck-on, waxy, tan-brown papules and/or plaques  - Benign-appearing - Discussed benign etiology and prognosis. - Observe - Call for any changes  Melanocytic Nevi - Tan-brown and/or pink-flesh-colored symmetric macules and papules - Benign appearing on exam today - Observation - Call clinic for new or changing moles - Recommend daily use of broad spectrum spf 30+ sunscreen to sun-exposed areas.   Hemangiomas - Red papules - Discussed benign nature - Observe - Call for any changes  Actinic Damage - Chronic condition, secondary to cumulative UV/sun exposure - diffuse scaly erythematous macules with underlying dyspigmentation - Recommend daily broad spectrum sunscreen SPF 30+  to sun-exposed areas, reapply every 2 hours as needed.  - Staying in the shade or wearing long sleeves, sun glasses (UVA+UVB protection) and wide brim hats (4-inch brim around the entire circumference of the hat) are also recommended for sun protection.  - Call for new or changing lesions.  Skin cancer screening performed today.  History of Dysplastic Nevi - No evidence of recurrence today - Recommend regular full body skin exams - Recommend daily broad spectrum sunscreen SPF 30+ to sun-exposed areas, reapply every 2 hours as needed.  - Call if any new or changing lesions are noted between office visits  Return in about 1 year (around 08/07/2022) for TBSE.  I, Harriett Sine, CMA, am acting as scribe for Forest Gleason, MD.  Documentation: I have reviewed the above documentation for accuracy and completeness, and I agree with the above.  Forest Gleason, MD

## 2021-08-07 NOTE — Patient Instructions (Addendum)
Recommend 400-800 units of Vitamin D daily  Melanoma ABCDEs  Melanoma is the most dangerous type of skin cancer, and is the leading cause of death from skin disease.  You are more likely to develop melanoma if you: Have light-colored skin, light-colored eyes, or red or blond hair Spend a lot of time in the sun Tan regularly, either outdoors or in a tanning bed Have had blistering sunburns, especially during childhood Have a close family member who has had a melanoma Have atypical moles or large birthmarks  Early detection of melanoma is key since treatment is typically straightforward and cure rates are extremely high if we catch it early.   The first sign of melanoma is often a change in a mole or a new dark spot.  The ABCDE system is a way of remembering the signs of melanoma.  A for asymmetry:  The two halves do not match. B for border:  The edges of the growth are irregular. C for color:  A mixture of colors are present instead of an even brown color. D for diameter:  Melanomas are usually (but not always) greater than 50mm - the size of a pencil eraser. E for evolution:  The spot keeps changing in size, shape, and color.  Please check your skin once per month between visits. You can use a small mirror in front and a large mirror behind you to keep an eye on the back side or your body.   If you see any new or changing lesions before your next follow-up, please call to schedule a visit.  Please continue daily skin protection including broad spectrum sunscreen SPF 30+ to sun-exposed areas, reapplying every 2 hours as needed when you're outdoors.   Staying in the shade or wearing long sleeves, sun glasses (UVA+UVB protection) and wide brim hats (4-inch brim around the entire circumference of the hat) are also recommended for sun protection.      Recommend daily broad spectrum sunscreen SPF 30+ to sun-exposed areas, reapply every 2 hours as needed. Call for new or changing lesions.   Staying in the shade or wearing long sleeves, sun glasses (UVA+UVB protection) and wide brim hats (4-inch brim around the entire circumference of the hat) are also recommended for sun protection.      Recommend taking Heliocare sun protection supplement daily in sunny weather for additional sun protection. For maximum protection on the sunniest days, you can take up to 2 capsules of regular Heliocare OR take 1 capsule of Heliocare Ultra. For prolonged exposure (such as a full day in the sun), you can repeat your dose of the supplement 4 hours after your first dose. Heliocare can be purchased at [facility] or at [email or web address].      If you have any questions or concerns for your doctor, please call our main line at [phone] and press option 4 to reach your doctor's medical assistant. If no one answers, please leave a voicemail as directed and we will return your call as soon as possible. Messages left after 4 pm will be answered the following business day.   You may also send [place] a message via [place]. We typically respond to MyChart messages within 1-2 business days.  For prescription refills, please ask your pharmacy to contact our office. Our fax number is [phone].  If you have an urgent issue when the clinic is closed that cannot wait until the next business day, you can page your doctor at the number below.  Please note that while we do our best to be available for urgent issues outside of office hours, we are not available 24/7.   If you have an urgent issue and are unable to reach [place], you may choose to seek medical care at your doctor's office, retail clinic, urgent care center, or emergency room.  If you have a medical emergency, please immediately call 911 or go to the emergency department.  Pager Numbers  - Dr. Nehemiah Massed: [phone]  - Dr. Laurence Ferrari: [phone]  - Dr. Nicole Kindred: [phone]  In the event of inclement weather, please call our main line at  [phone] for an update on the status of any delays or closures.  Dermatology Medication Tips: Please keep the boxes that topical medications come in in order to help keep track of the instructions about where and how to use these. Pharmacies typically print the medication instructions only on the boxes and not directly on the medication tubes.   If your medication is too expensive, please contact our office at [phone] option 4 or send [place] a message through [place].   We are unable to tell what your co-pay for medications will be in advance as this is different depending on your insurance coverage. However, we may be able to find a substitute medication at lower cost or fill out paperwork to get insurance to cover a needed medication.   If a prior authorization is required to get your medication covered by your insurance company, please allow [place] 1-2 business days to complete this process.  Drug prices often vary depending on where the prescription is filled and some pharmacies may offer cheaper prices.  The website www.goodrx.com contains coupons for medications through different pharmacies. The prices here do not account for what the cost may be with help from insurance (it may be cheaper with your insurance), but the website can give you the price if you did not use any insurance.  - You can print the associated coupon and take it with your prescription to the pharmacy.  - You may also stop by our office during regular business hours and pick up a GoodRx coupon card.  - If you need your prescription sent electronically to a different pharmacy, notify our office through [facility] or by phone at [phone] option 4.

## 2021-08-14 ENCOUNTER — Other Ambulatory Visit: Payer: Self-pay

## 2021-08-15 ENCOUNTER — Other Ambulatory Visit: Payer: Self-pay

## 2021-08-18 ENCOUNTER — Other Ambulatory Visit: Payer: Self-pay

## 2021-08-19 ENCOUNTER — Other Ambulatory Visit: Payer: Self-pay

## 2021-08-19 MED ORDER — LAMOTRIGINE ER 50 MG PO TB24
ORAL_TABLET | ORAL | 2 refills | Status: DC
  Filled 2021-08-19: qty 90, 90d supply, fill #0
  Filled 2022-01-02: qty 90, 90d supply, fill #1

## 2021-08-19 MED ORDER — LAMOTRIGINE ER 100 MG PO TB24
ORAL_TABLET | ORAL | 3 refills | Status: DC
  Filled 2021-08-19: qty 30, 30d supply, fill #0
  Filled 2021-10-03: qty 30, 30d supply, fill #1
  Filled 2021-11-20: qty 30, 30d supply, fill #2
  Filled 2022-01-08: qty 30, 30d supply, fill #3

## 2021-08-19 MED ORDER — LAMOTRIGINE ER 200 MG PO TB24
ORAL_TABLET | ORAL | 2 refills | Status: DC
  Filled 2021-08-19: qty 90, 90d supply, fill #0
  Filled 2022-01-02: qty 90, 90d supply, fill #1

## 2021-08-20 ENCOUNTER — Other Ambulatory Visit: Payer: Self-pay

## 2021-08-22 ENCOUNTER — Other Ambulatory Visit: Payer: Self-pay

## 2021-09-12 ENCOUNTER — Ambulatory Visit: Payer: 59 | Attending: Internal Medicine

## 2021-09-12 ENCOUNTER — Other Ambulatory Visit: Payer: Self-pay

## 2021-09-12 DIAGNOSIS — Z23 Encounter for immunization: Secondary | ICD-10-CM

## 2021-09-12 MED ORDER — PFIZER COVID-19 VAC BIVALENT 30 MCG/0.3ML IM SUSP
INTRAMUSCULAR | 0 refills | Status: DC
  Filled 2021-09-12: qty 0.3, 1d supply, fill #0

## 2021-09-12 NOTE — Progress Notes (Signed)
   Covid-19 Vaccination Clinic  Name:  Colton Perez    MRN: [id number] DOB: 1971-10-25  09/12/2021  Mr. [name] was observed post Covid-19 immunization for 15 minutes without incident. He was provided with Vaccine Information Sheet and instruction to access the V-Safe system.   Mr. [name] was instructed to call 911 with any severe reactions post vaccine: Difficulty breathing  Swelling of face and throat  A fast heartbeat  A bad rash all over body  Dizziness and weakness   Immunizations Administered     Name Date Dose VIS Date Route   Pfizer Covid-19 Vaccine Bivalent Booster 09/12/2021  9:04 AM 0.3 mL 07/09/2021 Intramuscular   Manufacturer: [place]   Lot: [place]: [place], PharmD, MBA Clinical Acute Care Pharmacist

## 2021-10-03 ENCOUNTER — Other Ambulatory Visit: Payer: Self-pay

## 2021-10-10 ENCOUNTER — Ambulatory Visit: Payer: 59 | Admitting: Certified Registered Nurse Anesthetist

## 2021-10-10 ENCOUNTER — Encounter
Admission: RE | Disposition: A | Discharge status: Home / Self Care | Payer: Self-pay | Source: Home / Self Care | Attending: Gastroenterology

## 2021-10-10 ENCOUNTER — Ambulatory Visit
Admission: RE | Admit: 2021-10-10 | Discharge: 2021-10-10 | Disposition: A | Discharge status: Home / Self Care | Payer: 59 | Attending: Gastroenterology | Admitting: Gastroenterology

## 2021-10-10 DIAGNOSIS — F17200 Nicotine dependence, unspecified, uncomplicated: Secondary | ICD-10-CM | POA: Diagnosis not present

## 2021-10-10 DIAGNOSIS — Z1211 Encounter for screening for malignant neoplasm of colon: Secondary | ICD-10-CM | POA: Insufficient documentation

## 2021-10-10 DIAGNOSIS — K649 Unspecified hemorrhoids: Secondary | ICD-10-CM | POA: Diagnosis not present

## 2021-10-10 DIAGNOSIS — K640 First degree hemorrhoids: Secondary | ICD-10-CM | POA: Insufficient documentation

## 2021-10-10 DIAGNOSIS — D649 Anemia, unspecified: Secondary | ICD-10-CM | POA: Diagnosis not present

## 2021-10-10 DIAGNOSIS — K635 Polyp of colon: Secondary | ICD-10-CM | POA: Diagnosis not present

## 2021-10-10 HISTORY — PX: COLONOSCOPY WITH PROPOFOL: SHX5780

## 2021-10-10 SURGERY — COLONOSCOPY WITH PROPOFOL
Anesthesia: General

## 2021-10-10 MED ORDER — PROPOFOL 10 MG/ML IV BOLUS
INTRAVENOUS | Status: DC | PRN
  Administered 2021-10-10: 60 mg via INTRAVENOUS

## 2021-10-10 MED ORDER — PROPOFOL 500 MG/50ML IV EMUL
INTRAVENOUS | Status: DC | PRN
Start: 2021-10-10 — End: 2021-10-10
  Administered 2021-10-10: 125 ug/kg/min via INTRAVENOUS

## 2021-10-10 MED ORDER — LIDOCAINE HCL (CARDIAC) PF 100 MG/5ML IV SOSY
PREFILLED_SYRINGE | INTRAVENOUS | Status: DC | PRN
  Administered 2021-10-10: 50 mg via INTRAVENOUS

## 2021-10-10 MED ORDER — SODIUM CHLORIDE 0.9 % IV SOLN
INTRAVENOUS | Status: DC
  Administered 2021-10-10: 20 mL/h via INTRAVENOUS

## 2021-10-10 NOTE — Anesthesia Postprocedure Evaluation (Signed)
Anesthesia Post Note  Patient: Colton Perez  Procedure(s) Performed: COLONOSCOPY WITH PROPOFOL  Patient location during evaluation: PACU Anesthesia Type: General Level of consciousness: awake and awake and alert Pain management: pain level controlled Vital Signs Assessment: post-procedure vital signs reviewed and stable Respiratory status: spontaneous breathing and respiratory function stable Cardiovascular status: blood pressure returned to baseline Anesthetic complications: no   No notable events documented.   Last Vitals:  Vitals:   10/10/21 0928 10/10/21 0938  BP: (!) 91/50 (!) 92/56  Pulse: (!) 59 62  Resp: 12 12  Temp:    SpO2: 96% 96%    Last Pain:  Vitals:   10/10/21 0938  TempSrc:   PainSc: 0-No pain                 VAN STAVEREN,Edker Punt

## 2021-10-10 NOTE — Op Note (Signed)
[facility] Patient Name: Colton Perez Procedure Date: 10/10/2021 8:44 AM MRN: [id number] Account #: [id number] Date of Birth: 09-07-1971 Admit Type: Outpatient Age: 50 Room: [facility] ENDO ROOM 3 Gender: Male Note Status: Finalized Instrument Name: Colonoscope [id number] Procedure:             Colonoscopy Indications:           Screening for colorectal malignant neoplasm Providers:             Andrey Farmer MD, MD Referring MD:          Ocie Cornfield. Ouida Sills MD, MD (Referring MD) Medicines:             Monitored Anesthesia Care Complications:         No immediate complications. Estimated blood loss:                         Minimal. Procedure:             Pre-Anesthesia Assessment:                        - Prior to the procedure, a History and Physical was                         performed, and patient medications and allergies were                         reviewed. The patient is competent. The risks and                         benefits of the procedure and the sedation options and                         risks were discussed with the patient. All questions                         were answered and informed consent was obtained.                         Patient identification and proposed procedure were                         verified by the physician, the nurse, the anesthetist                         and the technician in the endoscopy suite. Mental                         Status Examination: alert and oriented. Airway                         Examination: normal oropharyngeal airway and neck                         mobility. Respiratory Examination: clear to                         auscultation. CV Examination: normal. Prophylactic  Antibiotics: The patient does not require prophylactic                         antibiotics. Prior Anticoagulants: The patient has                         taken no previous anticoagulant or  antiplatelet                         agents. ASA Grade Assessment: II - A patient with mild                         systemic disease. After reviewing the risks and                         benefits, the patient was deemed in satisfactory                         condition to undergo the procedure. The anesthesia                         plan was to use monitored anesthesia care (MAC).                         Immediately prior to administration of medications,                         the patient was re-assessed for adequacy to receive                         sedatives. The heart rate, respiratory rate, oxygen                         saturations, blood pressure, adequacy of pulmonary                         ventilation, and response to care were monitored                         throughout the procedure. The physical status of the                         patient was re-assessed after the procedure.                        After obtaining informed consent, the colonoscope was                         passed under direct vision. Throughout the procedure,                         the patient's blood pressure, pulse, and oxygen                         saturations were monitored continuously. The                         Colonoscope was introduced through the anus and  advanced to the the cecum, identified by appendiceal                         orifice and ileocecal valve. The colonoscopy was                         somewhat difficult due to significant looping.                         Successful completion of the procedure was aided by                         applying abdominal pressure. The patient tolerated the                         procedure well. The quality of the bowel preparation                         was good. Findings:      The perianal and digital rectal examinations were normal.      Two hyperplastic polyps were found in the recto-sigmoid colon. The       polyps  were 1 to 2 mm in size. These polyps were removed with a jumbo       cold forceps. Resection and retrieval were complete. Estimated blood       loss was minimal.      Internal hemorrhoids were found during retroflexion. The hemorrhoids       were Grade I (internal hemorrhoids that do not prolapse).      The exam was otherwise without abnormality on direct and retroflexion       views. Impression:            - Two 1 to 2 mm polyps at the recto-sigmoid colon,                         removed with a jumbo cold forceps. Resected and                         retrieved.                        - Internal hemorrhoids.                        - The examination was otherwise normal on direct and                         retroflexion views. Recommendation:        - Discharge patient to home.                        - Resume previous diet.                        - Continue present medications.                        - Await pathology results.                        - Repeat colonoscopy for  surveillance based on                         pathology results.                        - Return to referring physician as previously                         scheduled. Procedure Code(s):     --- Professional ---                        [phone], Colonoscopy, flexible; with biopsy, single or                         multiple Diagnosis Code(s):     --- Professional ---                        Z12.11, Encounter for screening for malignant neoplasm                         of colon                        K63.5, Polyp of colon                        K64.0, First degree hemorrhoids CPT copyright 2019 American [employer]. All rights reserved. The codes documented in this report are preliminary and upon coder review may  be revised to meet current compliance requirements. Andrey Farmer MD, MD 10/10/2021 9:18:57 AM Number of Addenda: 0 Note Initiated On: 10/10/2021 8:44 AM Scope Withdrawal Time: 0 hours 12 minutes 45  seconds  Total Procedure Duration: 0 hours 21 minutes 49 seconds  Estimated Blood Loss:  Estimated blood loss was minimal.      [facility]

## 2021-10-10 NOTE — Anesthesia Preprocedure Evaluation (Signed)
Anesthesia Evaluation  Patient identified by MRN, date of birth, ID band Patient awake    Reviewed: Allergy & Precautions, NPO status , Patient's Chart, lab work & pertinent test results  Airway Mallampati: II  TM Distance: >3 FB Neck ROM: full    Dental  (+) Teeth Intact   Pulmonary neg pulmonary ROS, Current Smoker,    Pulmonary exam normal  + decreased breath sounds      Cardiovascular negative cardio ROS Normal cardiovascular exam Rhythm:Regular Rate:Normal     Neuro/Psych Seizures -, Well Controlled,  negative neurological ROS  negative psych ROS   GI/Hepatic negative GI ROS, Neg liver ROS,   Endo/Other  negative endocrine ROS  Renal/GU negative Renal ROS  negative genitourinary   Musculoskeletal negative musculoskeletal ROS (+)   Abdominal Normal abdominal exam  (+)   Peds negative pediatric ROS (+)  Hematology negative hematology ROS (+) Blood dyscrasia, anemia ,   Anesthesia Other Findings Past Medical History: No date: Blood dyscrasia No date: Cytopenia     Comment:  PSEUDOCYTOPENIA-    LOW   PLATELETS,  H+H   08/01/2020: Dysplastic nevus     Comment:  L back left of midline - mod No date: Seizures (HCC) No date: Spherocytosis [facility])  Past Surgical History: 07/28/2018: CHOLECYSTECTOMY; N/A     Comment:  Procedure: LAPAROSCOPIC CHOLECYSTECTOMY WITH               INTRAOPERATIVE CHOLANGIOGRAM ERAS PATHWAY;  Surgeon:               Donnie Mesa, MD;  Location: [place];  Service: General;               Laterality: N/A; 07/28/2018: CYST EXCISION PERINEAL; N/A     Comment:  Procedure: CYST EXCISION PERINEAL;  Surgeon: Donnie Mesa, MD;  Location: [place];  Service: General;                Laterality: N/A; 1996: PILONIDAL CYST EXCISION 1978: TESTICULAR EXPLORATION No date: WISDOM TOOTH EXTRACTION  BMI    Body Mass Index: 36.02 kg/m      Reproductive/Obstetrics negative OB ROS                              Anesthesia Physical Anesthesia Plan  ASA: 3  Anesthesia Plan: General   Post-op Pain Management:    Induction: Intravenous  PONV Risk Score and Plan: Propofol infusion and TIVA  Airway Management Planned: Natural Airway and Nasal Cannula  Additional Equipment:   Intra-op Plan:   Post-operative Plan:   Informed Consent: I have reviewed the patients History and Physical, chart, labs and discussed the procedure including the risks, benefits and alternatives for the proposed anesthesia with the patient or authorized representative who has indicated his/her understanding and acceptance.     Dental Advisory Given  Plan Discussed with: CRNA  Anesthesia Plan Comments:         Anesthesia Quick Evaluation

## 2021-10-10 NOTE — Interval H&P Note (Signed)
History and Physical Interval Note:  10/10/2021 8:47 AM  Colton Perez  has presented today for surgery, with the diagnosis of Colon Cancer Screening.  The various methods of treatment have been discussed with the patient and family. After consideration of risks, benefits and other options for treatment, the patient has consented to  Procedure(s) with comments: COLONOSCOPY WITH PROPOFOL (N/A) - REQUESTS NO STUDENT CRNA as a surgical intervention.  The patient's history has been reviewed, patient examined, no change in status, stable for surgery.  I have reviewed the patient's chart and labs.  Questions were answered to the patient's satisfaction.     Lesly Rubenstein  Ok to proceed with colonoscopy

## 2021-10-10 NOTE — H&P (Signed)
Outpatient short stay form Pre-procedure 10/10/2021  Colton Rubenstein, MD  Primary Physician: Kirk Ruths, MD  Reason for visit:  Screening colonoscopy  History of present illness:   50 y/o gentleman with history of seizure disorder here for screening colonoscopy. No blood thinners. No family history of GI malignancies. Has a history of a cholecystectomy.    Current Facility-Administered Medications:    0.9 %  sodium chloride infusion, , Intravenous, Continuous, Earnie Bechard, Hilton Cork, MD, Last Rate: 20 mL/hr at 10/10/21 0835, 20 mL/hr at 10/10/21 0835  Medications Prior to Admission  Medication Sig Dispense Refill Last Dose   acetaminophen (TYLENOL) 500 MG tablet Take 1,000 mg by mouth every 6 (six) hours as needed (for pain.).   10/09/2021   carbamazepine (TEGRETOL XR) 200 MG 12 hr tablet Take 800 mg by mouth daily.    10/09/2021   COVID-19 mRNA bivalent vaccine, Pfizer, (PFIZER COVID-19 VAC BIVALENT) injection Inject into the muscle. 0.3 mL 0 10/09/2021   esomeprazole (NEXIUM) 40 MG capsule Take 1 capsule by mouth daily.  30-60 minutes prior to evening meals 30 capsule 10 10/09/2021   fluticasone (FLONASE) 50 MCG/ACT nasal spray Place 2 sprays into both nostrils daily. 16 g 0 10/09/2021   gabapentin (NEURONTIN) 100 MG capsule Take 100 mg twice a day for one week, then increase to 200 mg(2 tablets) twice a day and continue 120 capsule 3 10/10/2021 at 0600   ibuprofen (ADVIL,MOTRIN) 200 MG tablet Take 600 mg by mouth every 8 (eight) hours as needed (for pain.).   Past Month   lamoTRIgine (LAMICTAL XR) 100 MG 24 hour tablet Take 1 tablet (100 mg total) by mouth once daily 30 tablet 3 10/09/2021   LamoTRIgine 100 MG TB24 24 hour tablet TAKE 1 TABLET BY MOUTH ONCE DAILY ALONG WITH 200 MG XR TABLET TO EQUAL 300 MG DAILY.   10/09/2021   LamoTRIgine 200 MG TB24 24 hour tablet Take 1 tab along with 100 mg tab to equal 300 mg daily/ 90 tablet 2 10/09/2021   LamoTRIgine 50 MG TB24 24 hour tablet  Take 1 tab along with 300 mg lamictal to total 350 mg XR daily 90 tablet 2 10/09/2021   polyethylene glycol-electrolytes (NULYTELY) 420 g solution Take 4,000 mLs by mouth as directed Split Colon Prep. 4000 mL 0 10/09/2021   LamoTRIgine 100 MG TB24 24 hour tablet TAKE 1 TAB BY MOUTH ONCE DAILY ALONG WITH 200 MG XR TAB TO EQUAL 300 MG DAILY. 90 tablet 2    LamoTRIgine 200 MG TB24 24 hour tablet TAKE 1 TABLET BY MOUTH ALONG WITH 100 MG TAB TO EQUAL 300 MG DAILY 90 tablet 2    LamoTRIgine 50 MG TB24 24 hour tablet TAKE 1 TABLET BY MOUTH ALONG WITH 300 MG LAMICTAL TO TOTAL 350 MG XR DAILY 90 tablet 2    Multiple Vitamin (MULTIVITAMIN WITH MINERALS) TABS tablet Take 1 tablet by mouth daily.        Allergies  Allergen Reactions   Asa [Aspirin] Swelling    SWELLING REACTION UNSPECIFIED      Past Medical History:  Diagnosis Date   Blood dyscrasia    Cytopenia    PSEUDOCYTOPENIA-    LOW   PLATELETS,  H+H     Dysplastic nevus 08/01/2020   L back left of midline - mod   Seizures (HCC)    Spherocytosis (HCC)     Review of systems:  Otherwise negative.    Physical Exam  Gen: Alert, oriented. Appears  stated age.  HEENT: PERRLA. Lungs: No respiratory distress CV: RRR Abd: soft, benign, no masses Ext: No edema    Planned procedures: Proceed with colonoscopy. The patient understands the nature of the planned procedure, indications, risks, alternatives and potential complications including but not limited to bleeding, infection, perforation, damage to internal organs and possible oversedation/side effects from anesthesia. The patient agrees and gives consent to proceed.  Please refer to procedure notes for findings, recommendations and patient disposition/instructions.     Colton Rubenstein, MD [facility]astroenterology

## 2021-10-10 NOTE — Transfer of Care (Signed)
Immediate Anesthesia Transfer of Care Note  Patient: Colton Perez  Procedure(s) Performed: COLONOSCOPY WITH PROPOFOL  Patient Location: PACU  Anesthesia Type:General  Level of Consciousness: awake, alert  and oriented  Airway & Oxygen Therapy: Patient Spontanous Breathing and Patient connected to nasal cannula oxygen  Post-op Assessment: Report given to RN and Post -op Vital signs reviewed and stable  Post vital signs: Reviewed and stable  Last Vitals:  Vitals Value Taken Time  BP    Temp    Pulse 43 10/10/21 0917  Resp 15 10/10/21 0917  SpO2 100 % 10/10/21 0917  Vitals shown include unvalidated device data.  Last Pain:  Vitals:   10/10/21 0823  TempSrc: Temporal  PainSc: 0-No pain         Complications: No notable events documented.

## 2021-10-13 ENCOUNTER — Encounter: Payer: Self-pay | Admitting: Gastroenterology

## 2021-10-13 LAB — SURGICAL PATHOLOGY

## 2021-10-21 DIAGNOSIS — H524 Presbyopia: Secondary | ICD-10-CM | POA: Diagnosis not present

## 2021-11-20 ENCOUNTER — Other Ambulatory Visit: Payer: Self-pay

## 2021-12-01 DIAGNOSIS — M79671 Pain in right foot: Secondary | ICD-10-CM | POA: Diagnosis not present

## 2021-12-01 DIAGNOSIS — M216X1 Other acquired deformities of right foot: Secondary | ICD-10-CM | POA: Diagnosis not present

## 2021-12-01 DIAGNOSIS — M722 Plantar fascial fibromatosis: Secondary | ICD-10-CM | POA: Diagnosis not present

## 2021-12-01 DIAGNOSIS — M7731 Calcaneal spur, right foot: Secondary | ICD-10-CM | POA: Diagnosis not present

## 2021-12-01 DIAGNOSIS — M216X2 Other acquired deformities of left foot: Secondary | ICD-10-CM | POA: Diagnosis not present

## 2021-12-22 DIAGNOSIS — M722 Plantar fascial fibromatosis: Secondary | ICD-10-CM | POA: Diagnosis not present

## 2021-12-22 DIAGNOSIS — M216X1 Other acquired deformities of right foot: Secondary | ICD-10-CM | POA: Diagnosis not present

## 2021-12-22 DIAGNOSIS — M79671 Pain in right foot: Secondary | ICD-10-CM | POA: Diagnosis not present

## 2021-12-22 DIAGNOSIS — M7731 Calcaneal spur, right foot: Secondary | ICD-10-CM | POA: Diagnosis not present

## 2022-01-02 ENCOUNTER — Other Ambulatory Visit: Payer: Self-pay

## 2022-01-08 ENCOUNTER — Other Ambulatory Visit: Payer: Self-pay

## 2022-03-03 ENCOUNTER — Other Ambulatory Visit: Payer: Self-pay

## 2022-03-04 ENCOUNTER — Other Ambulatory Visit: Payer: Self-pay

## 2022-03-04 MED ORDER — LAMOTRIGINE ER 100 MG PO TB24
ORAL_TABLET | ORAL | 3 refills | Status: DC
  Filled 2022-03-04: qty 30, 30d supply, fill #0
  Filled 2022-10-15: qty 30, 30d supply, fill #1
  Filled 2022-11-16: qty 30, 30d supply, fill #2

## 2022-03-05 DIAGNOSIS — Z0000 Encounter for general adult medical examination without abnormal findings: Secondary | ICD-10-CM | POA: Diagnosis not present

## 2022-03-05 DIAGNOSIS — Z13220 Encounter for screening for lipoid disorders: Secondary | ICD-10-CM | POA: Diagnosis not present

## 2022-03-05 DIAGNOSIS — D580 Hereditary spherocytosis: Secondary | ICD-10-CM | POA: Diagnosis not present

## 2022-03-05 DIAGNOSIS — G40309 Generalized idiopathic epilepsy and epileptic syndromes, not intractable, without status epilepticus: Secondary | ICD-10-CM | POA: Diagnosis not present

## 2022-03-12 ENCOUNTER — Other Ambulatory Visit: Payer: Self-pay

## 2022-03-12 DIAGNOSIS — D693 Immune thrombocytopenic purpura: Secondary | ICD-10-CM | POA: Diagnosis not present

## 2022-03-12 DIAGNOSIS — R17 Unspecified jaundice: Secondary | ICD-10-CM | POA: Diagnosis not present

## 2022-03-12 DIAGNOSIS — Z0000 Encounter for general adult medical examination without abnormal findings: Secondary | ICD-10-CM | POA: Diagnosis not present

## 2022-03-12 DIAGNOSIS — D580 Hereditary spherocytosis: Secondary | ICD-10-CM | POA: Diagnosis not present

## 2022-03-12 DIAGNOSIS — R569 Unspecified convulsions: Secondary | ICD-10-CM | POA: Diagnosis not present

## 2022-03-12 MED ORDER — ESOMEPRAZOLE MAGNESIUM 40 MG PO CPDR
DELAYED_RELEASE_CAPSULE | ORAL | 3 refills | Status: DC
  Filled 2022-03-12: qty 90, 90d supply, fill #0
  Filled 2022-08-09 – 2022-11-16 (×2): qty 90, 90d supply, fill #1
  Filled 2023-02-17: qty 90, 90d supply, fill #2

## 2022-03-13 ENCOUNTER — Other Ambulatory Visit: Payer: Self-pay | Admitting: Internal Medicine

## 2022-03-13 DIAGNOSIS — R17 Unspecified jaundice: Secondary | ICD-10-CM

## 2022-04-01 ENCOUNTER — Ambulatory Visit
Admission: RE | Admit: 2022-04-01 | Discharge: 2022-04-01 | Disposition: A | Discharge status: Home / Self Care | Payer: 59 | Source: Ambulatory Visit | Attending: Internal Medicine | Admitting: Internal Medicine

## 2022-04-01 DIAGNOSIS — R17 Unspecified jaundice: Secondary | ICD-10-CM | POA: Diagnosis not present

## 2022-04-01 DIAGNOSIS — Z9049 Acquired absence of other specified parts of digestive tract: Secondary | ICD-10-CM | POA: Diagnosis not present

## 2022-04-01 DIAGNOSIS — R161 Splenomegaly, not elsewhere classified: Secondary | ICD-10-CM | POA: Diagnosis not present

## 2022-04-06 DIAGNOSIS — S2242XA Multiple fractures of ribs, left side, initial encounter for closed fracture: Secondary | ICD-10-CM | POA: Diagnosis not present

## 2022-04-06 DIAGNOSIS — M25552 Pain in left hip: Secondary | ICD-10-CM | POA: Diagnosis not present

## 2022-04-07 ENCOUNTER — Other Ambulatory Visit: Payer: Self-pay

## 2022-04-07 MED ORDER — KETOROLAC TROMETHAMINE 10 MG PO TABS
ORAL_TABLET | ORAL | 0 refills | Status: DC
  Filled 2022-04-07: qty 20, 5d supply, fill #0

## 2022-04-07 MED ORDER — METHOCARBAMOL 500 MG PO TABS
ORAL_TABLET | ORAL | 0 refills | Status: DC
  Filled 2022-04-07: qty 15, 5d supply, fill #0

## 2022-04-13 DIAGNOSIS — R0781 Pleurodynia: Secondary | ICD-10-CM | POA: Diagnosis not present

## 2022-05-07 ENCOUNTER — Other Ambulatory Visit: Payer: Self-pay

## 2022-05-07 DIAGNOSIS — R569 Unspecified convulsions: Secondary | ICD-10-CM | POA: Diagnosis not present

## 2022-05-07 DIAGNOSIS — M545 Low back pain, unspecified: Secondary | ICD-10-CM | POA: Diagnosis not present

## 2022-05-07 MED ORDER — LAMOTRIGINE ER 200 MG PO TB24
ORAL_TABLET | ORAL | 3 refills | Status: DC
  Filled 2022-05-07: qty 90, 90d supply, fill #0
  Filled 2022-10-15: qty 90, 90d supply, fill #1
  Filled 2023-02-17: qty 90, 90d supply, fill #2

## 2022-05-07 MED ORDER — LAMOTRIGINE ER 100 MG PO TB24
ORAL_TABLET | ORAL | 3 refills | Status: DC
  Filled 2022-05-07: qty 30, 30d supply, fill #0
  Filled 2022-05-11: qty 60, 60d supply, fill #0
  Filled 2023-02-17: qty 90, 90d supply, fill #1

## 2022-05-07 MED ORDER — LAMOTRIGINE ER 50 MG PO TB24
ORAL_TABLET | ORAL | 3 refills | Status: DC
  Filled 2022-05-07: qty 90, 90d supply, fill #0
  Filled 2022-10-15: qty 90, 90d supply, fill #1
  Filled 2023-02-17: qty 90, 90d supply, fill #2

## 2022-05-08 ENCOUNTER — Other Ambulatory Visit: Payer: Self-pay

## 2022-05-11 ENCOUNTER — Other Ambulatory Visit: Payer: Self-pay

## 2022-08-04 DIAGNOSIS — J069 Acute upper respiratory infection, unspecified: Secondary | ICD-10-CM | POA: Diagnosis not present

## 2022-08-10 ENCOUNTER — Other Ambulatory Visit (HOSPITAL_COMMUNITY): Payer: Self-pay

## 2022-08-12 ENCOUNTER — Other Ambulatory Visit (HOSPITAL_COMMUNITY): Payer: Self-pay

## 2022-08-13 ENCOUNTER — Encounter: Payer: 59 | Admitting: Dermatology

## 2022-08-13 ENCOUNTER — Telehealth: Payer: Self-pay

## 2022-08-13 NOTE — Telephone Encounter (Signed)
Left voicemail to return my call to reschedule his appointment.

## 2022-08-19 DIAGNOSIS — K219 Gastro-esophageal reflux disease without esophagitis: Secondary | ICD-10-CM | POA: Diagnosis not present

## 2022-08-19 DIAGNOSIS — R490 Dysphonia: Secondary | ICD-10-CM | POA: Diagnosis not present

## 2022-08-25 ENCOUNTER — Ambulatory Visit: Payer: 59 | Admitting: Dermatology

## 2022-08-25 ENCOUNTER — Encounter: Payer: Self-pay | Admitting: Dermatology

## 2022-08-25 DIAGNOSIS — R17 Unspecified jaundice: Secondary | ICD-10-CM | POA: Diagnosis not present

## 2022-08-25 DIAGNOSIS — L814 Other melanin hyperpigmentation: Secondary | ICD-10-CM | POA: Diagnosis not present

## 2022-08-25 DIAGNOSIS — D489 Neoplasm of uncertain behavior, unspecified: Secondary | ICD-10-CM | POA: Diagnosis not present

## 2022-08-25 DIAGNOSIS — Z86018 Personal history of other benign neoplasm: Secondary | ICD-10-CM

## 2022-08-25 DIAGNOSIS — L578 Other skin changes due to chronic exposure to nonionizing radiation: Secondary | ICD-10-CM

## 2022-08-25 DIAGNOSIS — Z1283 Encounter for screening for malignant neoplasm of skin: Secondary | ICD-10-CM

## 2022-08-25 DIAGNOSIS — L821 Other seborrheic keratosis: Secondary | ICD-10-CM | POA: Diagnosis not present

## 2022-08-25 DIAGNOSIS — D229 Melanocytic nevi, unspecified: Secondary | ICD-10-CM | POA: Diagnosis not present

## 2022-08-25 NOTE — Patient Instructions (Addendum)
Recommend daily broad spectrum sunscreen SPF 30+ to sun-exposed areas, reapply every 2 hours as needed. Call for new or changing lesions.  Staying in the shade or wearing long sleeves, sun glasses (UVA+UVB protection) and wide brim hats (4-inch brim around the entire circumference of the hat) are also recommended for sun protection.    Melanoma ABCDEs  Melanoma is the most dangerous type of skin cancer, and is the leading cause of death from skin disease.  You are more likely to develop melanoma if you: Have light-colored skin, light-colored eyes, or red or blond hair Spend a lot of time in the sun Tan regularly, either outdoors or in a tanning bed Have had blistering sunburns, especially during childhood Have a close family member who has had a melanoma Have atypical moles or large birthmarks  Early detection of melanoma is key since treatment is typically straightforward and cure rates are extremely high if we catch it early.   The first sign of melanoma is often a change in a mole or a new dark spot.  The ABCDE system is a way of remembering the signs of melanoma.  A for asymmetry:  The two halves do not match. B for border:  The edges of the growth are irregular. C for color:  A mixture of colors are present instead of an even brown color. D for diameter:  Melanomas are usually (but not always) greater than 6mm - the size of a pencil eraser. E for evolution:  The spot keeps changing in size, shape, and color.  Please check your skin once per month between visits. You can use a small mirror in front and a large mirror behind you to keep an eye on the back side or your body.   If you see any new or changing lesions before your next follow-up, please call to schedule a visit.  Please continue daily skin protection including broad spectrum sunscreen SPF 30+ to sun-exposed areas, reapplying every 2 hours as needed when you're outdoors.   Staying in the shade or wearing long sleeves, sun  glasses (UVA+UVB protection) and wide brim hats (4-inch brim around the entire circumference of the hat) are also recommended for sun protection.     Due to recent changes in healthcare laws, you may see results of your pathology and/or laboratory studies on MyChart before the doctors have had a chance to review them. We understand that in some cases there may be results that are confusing or concerning to you. Please understand that not all results are received at the same time and often the doctors may need to interpret multiple results in order to provide you with the best plan of care or course of treatment. Therefore, we ask that you please give us 2 business days to thoroughly review all your results before contacting the office for clarification. Should we see a critical lab result, you will be contacted sooner.   If You Need Anything After Your Visit  If you have any questions or concerns for your doctor, please call our main line at [phone] and press option 4 to reach your doctor's medical assistant. If no one answers, please leave a voicemail as directed and we will return your call as soon as possible. Messages left after 4 pm will be answered the following business day.   You may also send us a message via MyChart. We typically respond to MyChart messages within 1-2 business days.  For prescription refills, please ask your pharmacy to contact   our office. Our fax number is [phone].  If you have an urgent issue when the clinic is closed that cannot wait until the next business day, you can page your doctor at the number below.    Please note that while we do our best to be available for urgent issues outside of office hours, we are not available 24/7.   If you have an urgent issue and are unable to reach us, you may choose to seek medical care at your doctor's office, retail clinic, urgent care center, or emergency room.  If you have a medical emergency, please immediately  call 911 or go to the emergency department.  Pager Numbers  - Dr. Kowalski: [phone]  - Dr. Moye: [phone]  - Dr. Stewart: [phone]  In the event of inclement weather, please call our main line at [phone] for an update on the status of any delays or closures.  Dermatology Medication Tips: Please keep the boxes that topical medications come in in order to help keep track of the instructions about where and how to use these. Pharmacies typically print the medication instructions only on the boxes and not directly on the medication tubes.   If your medication is too expensive, please contact our office at [phone] option 4 or send us a message through MyChart.   We are unable to tell what your co-pay for medications will be in advance as this is different depending on your insurance coverage. However, we may be able to find a substitute medication at lower cost or fill out paperwork to get insurance to cover a needed medication.   If a prior authorization is required to get your medication covered by your insurance company, please allow us 1-2 business days to complete this process.  Drug prices often vary depending on where the prescription is filled and some pharmacies may offer cheaper prices.  The website www.goodrx.com contains coupons for medications through different pharmacies. The prices here do not account for what the cost may be with help from insurance (it may be cheaper with your insurance), but the website can give you the price if you did not use any insurance.  - You can print the associated coupon and take it with your prescription to the pharmacy.  - You may also stop by our office during regular business hours and pick up a GoodRx coupon card.  - If you need your prescription sent electronically to a different pharmacy, notify our office through [place] MyChart or by phone at [phone] option 4.     Si Usted Necesita Algo Despus de Su  Visita  Tambin puede enviarnos un mensaje a travs de MyChart. Por lo general respondemos a los mensajes de MyChart en el transcurso de 1 a 2 das hbiles.  Para renovar recetas, por favor pida a su farmacia que se ponga en contacto con nuestra oficina. Nuestro nmero de fax es el [phone].  Si tiene un asunto urgente cuando la clnica est cerrada y que no puede esperar hasta el siguiente da hbil, puede llamar/localizar a su doctor(a) al nmero que aparece a continuacin.   Por favor, tenga en cuenta que aunque hacemos todo lo posible para estar disponibles para asuntos urgentes fuera del horario de oficina, no estamos disponibles las 24 horas del da, los 7 das de la semana.   Si tiene un problema urgente y no puede comunicarse con nosotros, puede optar por buscar atencin mdica  en el consultorio de su doctor(a), en una clnica privada,   en un centro de atencin urgente o en una sala de emergencias.  Si tiene una emergencia mdica, por favor llame inmediatamente al 911 o vaya a la sala de emergencias.  Nmeros de bper  - Dr. Kowalski: [phone]  - Dra. Moye: [phone]  - Dra. Stewart: [phone]  En caso de inclemencias del tiempo, por favor llame a nuestra lnea principal al [phone] para una actualizacin sobre el estado de cualquier retraso o cierre.  Consejos para la medicacin en dermatologa: Por favor, guarde las cajas en las que vienen los medicamentos de uso tpico para ayudarle a seguir las instrucciones sobre dnde y cmo usarlos. Las farmacias generalmente imprimen las instrucciones del medicamento slo en las cajas y no directamente en los tubos del medicamento.   Si su medicamento es muy caro, por favor, pngase en contacto con nuestra oficina llamando al [phone] y presione la opcin 4 o envenos un mensaje a travs de MyChart.   No podemos decirle cul ser su copago por los medicamentos por adelantado ya que esto es diferente dependiendo de la  cobertura de su seguro. Sin embargo, es posible que podamos encontrar un medicamento sustituto a menor costo o llenar un formulario para que el seguro cubra el medicamento que se considera necesario.   Si se requiere una autorizacin previa para que su compaa de seguros cubra su medicamento, por favor permtanos de 1 a 2 das hbiles para completar este proceso.  Los precios de los medicamentos varan con frecuencia dependiendo del lugar de dnde se surte la receta y alguna farmacias pueden ofrecer precios ms baratos.  El sitio web www.goodrx.com tiene cupones para medicamentos de diferentes farmacias. Los precios aqu no tienen en cuenta lo que podra costar con la ayuda del seguro (puede ser ms barato con su seguro), pero el sitio web puede darle el precio si no utiliz ningn seguro.  - Puede imprimir el cupn correspondiente y llevarlo con su receta a la farmacia.  - Tambin puede pasar por nuestra oficina durante el horario de atencin regular y recoger una tarjeta de cupones de GoodRx.  - Si necesita que su receta se enve electrnicamente a una farmacia diferente, informe a nuestra oficina a travs de MyChart de  o por telfono llamando al [phone] y presione la opcin 4.  

## 2022-08-25 NOTE — Progress Notes (Signed)
Follow-Up Visit   Subjective  Colton Perez is a 51 y.o. male who presents for the following: Annual Exam (1 year tbse, hx of isk, hx of dysplastic nevi. Reports a spot at left forehead. ).  The patient presents for Total-Body Skin Exam (TBSE) for skin cancer screening and mole check.  The patient has spots, moles and lesions to be evaluated, some may be new or changing and the patient has concerns that these could be cancer.  The following portions of the chart were reviewed this encounter and updated as appropriate:  Tobacco  Allergies  Meds  Problems  Med Hx  Surg Hx  Fam Hx      Review of Systems: No other skin or systemic complaints except as noted in HPI or Assessment and Plan.   Objective  Well appearing patient in no apparent distress; mood and affect are within normal limits.  A full examination was performed including scalp, head, eyes, ears, nose, lips, neck, chest, axillae, abdomen, back, buttocks, bilateral upper extremities, bilateral lower extremities, hands, feet, fingers, toes, fingernails, and toenails. All findings within normal limits unless otherwise noted below.  Left Forehead 0.5 cm light brown macule         Assessment & Plan  Neoplasm of uncertain behavior Left Forehead  Favor lentigo   Will monitor for changes  Benign-appearing.  Observation.  Call clinic for new or changing lesions.  Recommend daily use of broad spectrum spf 30+ sunscreen to sun-exposed areas.      Jaundice b/l eyes  With icterus; slight yellowing of conjunctivae and skin  Slightly elevated liver tests 02/2022; patient is being monitored by pcp   Patient reports believed to be caused from long term use of medication   History of Dysplastic Nevi. Left back, left of midline. Moderate. 08/01/2020. - No evidence of recurrence today - Recommend regular full body skin exams - Recommend daily broad spectrum sunscreen SPF 30+ to sun-exposed areas, reapply every 2 hours  as needed.  - Call if any new or changing lesions are noted between office visits   Lentigines - Scattered tan macules - Due to sun exposure - Benign-appearing, observe - Recommend daily broad spectrum sunscreen SPF 30+ to sun-exposed areas, reapply every 2 hours as needed. - Call for any changes  Seborrheic Keratoses - Stuck-on, waxy, tan-brown papules and/or plaques  - Benign-appearing - Discussed benign etiology and prognosis. - Observe - Call for any changes  Melanocytic Nevi - Tan-brown and/or pink-flesh-colored symmetric macules and papules - Benign appearing on exam today - Observation - Call clinic for new or changing moles - Recommend daily use of broad spectrum spf 30+ sunscreen to sun-exposed areas.   Hemangiomas - Red papules - Discussed benign nature - Observe - Call for any changes  Actinic Damage - Chronic condition, secondary to cumulative UV/sun exposure - diffuse scaly erythematous macules with underlying dyspigmentation - Recommend daily broad spectrum sunscreen SPF 30+ to sun-exposed areas, reapply every 2 hours as needed.  - Staying in the shade or wearing long sleeves, sun glasses (UVA+UVB protection) and wide brim hats (4-inch brim around the entire circumference of the hat) are also recommended for sun protection.  - Call for new or changing lesions.  Skin cancer screening performed today.   Return in about 1 year (around 08/26/2023) for TBSE.  I, Emelia Salisbury, CMA, am acting as scribe for Forest Gleason, MD.  Documentation: I have reviewed the above documentation for accuracy and completeness, and I agree with  the above.  Forest Gleason, MD

## 2022-09-03 ENCOUNTER — Encounter: Payer: Self-pay | Admitting: Dermatology

## 2022-09-21 ENCOUNTER — Other Ambulatory Visit: Payer: Self-pay

## 2022-09-21 MED ORDER — VARENICLINE TARTRATE (STARTER) 0.5 MG X 11 & 1 MG X 42 PO TBPK
ORAL_TABLET | ORAL | 0 refills | Status: DC
  Filled 2022-09-21: qty 53, 28d supply, fill #0
  Filled 2022-10-08: qty 53, 30d supply, fill #0

## 2022-10-02 ENCOUNTER — Other Ambulatory Visit: Payer: Self-pay

## 2022-10-08 ENCOUNTER — Other Ambulatory Visit: Payer: Self-pay

## 2022-10-15 ENCOUNTER — Other Ambulatory Visit: Payer: Self-pay

## 2022-11-04 DIAGNOSIS — R0981 Nasal congestion: Secondary | ICD-10-CM | POA: Diagnosis not present

## 2022-11-04 DIAGNOSIS — H6993 Unspecified Eustachian tube disorder, bilateral: Secondary | ICD-10-CM | POA: Diagnosis not present

## 2022-11-16 ENCOUNTER — Other Ambulatory Visit: Payer: Self-pay

## 2023-01-20 ENCOUNTER — Ambulatory Visit: Payer: Commercial Managed Care - PPO | Admitting: Nurse Practitioner

## 2023-01-20 ENCOUNTER — Encounter: Payer: Self-pay | Admitting: Nurse Practitioner

## 2023-01-20 VITALS — BP 118/64 | HR 72 | Temp 98.6°F | Ht 67.5 in | Wt 229.6 lb

## 2023-01-20 DIAGNOSIS — Z0000 Encounter for general adult medical examination without abnormal findings: Secondary | ICD-10-CM | POA: Diagnosis not present

## 2023-01-20 DIAGNOSIS — Z1329 Encounter for screening for other suspected endocrine disorder: Secondary | ICD-10-CM

## 2023-01-20 DIAGNOSIS — D693 Immune thrombocytopenic purpura: Secondary | ICD-10-CM | POA: Diagnosis not present

## 2023-01-20 DIAGNOSIS — Z1159 Encounter for screening for other viral diseases: Secondary | ICD-10-CM | POA: Diagnosis not present

## 2023-01-20 DIAGNOSIS — Z125 Encounter for screening for malignant neoplasm of prostate: Secondary | ICD-10-CM

## 2023-01-20 DIAGNOSIS — R17 Unspecified jaundice: Secondary | ICD-10-CM

## 2023-01-20 DIAGNOSIS — Z114 Encounter for screening for human immunodeficiency virus [HIV]: Secondary | ICD-10-CM

## 2023-01-20 DIAGNOSIS — Z13220 Encounter for screening for lipoid disorders: Secondary | ICD-10-CM | POA: Diagnosis not present

## 2023-01-20 DIAGNOSIS — D580 Hereditary spherocytosis: Secondary | ICD-10-CM

## 2023-01-20 DIAGNOSIS — R569 Unspecified convulsions: Secondary | ICD-10-CM | POA: Diagnosis not present

## 2023-01-20 NOTE — Assessment & Plan Note (Addendum)
Hereditary. Will refer to Hematology.

## 2023-01-20 NOTE — Assessment & Plan Note (Signed)
Chronic. No new seizures or seizure like activity since 2005. Continue Lamictal 350 mg daily. Follow up with Neurology.

## 2023-01-20 NOTE — Assessment & Plan Note (Addendum)
Had abdominal US in May 2023 with no further work up. Denies any abdominal pains. Will check hepatic function panel. If continues to be elevated will proceed with additional imaging and refer to GI.

## 2023-01-20 NOTE — Progress Notes (Signed)
Tomasita Morrow, NP-C Phone: [phone]  Colton Perez is a 52 y.o. male who presents today to establish care and for annual exam. He has no complaints or new concerns. He is requesting a referral to Hematology for his Spherocytosis and Chronic ITP. He will return for fasting lab work. He sees Neurology for seizure like activity.  GERD:   Reflux symptoms: No   Abd pain: No   Blood in stool: No  Dysphagia: No   EGD: No  Medication: Nexium 40 mg  Diet: Trying to improve, recently down approx 17 lbs. 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, keto, increased protein Exercise: None Colonoscopy: 10/10/2021- 10 year recall Prostate cancer screening: 2023 Family history-  Prostate cancer: No  Colon cancer: No Sexually active: Yes Vaccines-   Flu: UTD  Tetanus: Unsure  Shingles: Interested  COVID19: x 3 HIV screening: Will order Hep C Screening: Will order Tobacco use: Yes, 1 pack per day x 35 years Alcohol use: Rarely, every 2-3 months Illicit Drug use: No Dentist: Yes Ophthalmology: Yes   Active Ambulatory Problems    Diagnosis Date Noted   History of seizures 09/11/2015   Chronic ITP (idiopathic thrombocytopenia) (HCC) 02/05/2015   Mild anemia 02/05/2015   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[place]) 02/05/2015   Spherocytosis ([place]) 01/20/2023   Seizure-like activity ([place]) 01/20/2023   Preventative health care 01/20/2023   Elevated bilirubin 01/20/2023   Resolved Ambulatory Problems    Diagnosis Date Noted   No Resolved Ambulatory Problems   Past Medical History:  Diagnosis Date   Blood dyscrasia    Chicken pox    Colon polyp    Cytopenia    Dysplastic nevus 08/01/2020   GERD (gastroesophageal reflux disease)    Seizures ([place])     Family History  Problem Relation Age of Onset   Hypertension Mother    Hyperlipidemia Mother    Asthma Mother    Cancer Maternal Grandmother        pancreatic    Social History   Socioeconomic History    Marital status: Married    Spouse name: Not on file   Number of children: Not on file   Years of education: Not on file   Highest education level: Not on file  Occupational History   Not on file  Tobacco Use   Smoking status: Every Day    Packs/day: 1.00    Years: 30.00    Total pack years: 30.00    Types: Cigarettes   Smokeless tobacco: Never  Vaping Use   Vaping Use: Never used  Substance and Sexual Activity   Alcohol use: Yes    Alcohol/week: 0.0 standard drinks of alcohol    Comment: rare   Drug use: Never   Sexual activity: Not on file  Other Topics Concern   Not on file  Social History Narrative   Not on file   Social Determinants of Health   Financial Resource Strain: Not on file  Food Insecurity: Not on file  Transportation Needs: Not on file  Physical Activity: Not on file  Stress: Not on file  Social Connections: Not on file  Intimate Partner Violence: Not on file   ROS  General:  Negative for unexplained weight loss, fever Skin: Negative for new or changing mole, sore that won't heal HEENT: Negative for trouble hearing, trouble seeing, ringing in ears, mouth sores, hoarseness, change in voice, dysphagia. CV:  Negative for chest pain, dyspnea, edema, palpitations Resp: Negative for cough, dyspnea, hemoptysis GI:  Negative for nausea, vomiting, diarrhea, constipation, abdominal pain, melena, hematochezia. GU: Negative for dysuria, incontinence, urinary hesitance, hematuria, vaginal or penile discharge, polyuria, sexual difficulty, lumps in testicle or breasts MSK: Negative for muscle cramps or aches, joint pain or swelling Neuro: Negative for headaches, weakness, numbness, dizziness, passing out/fainting Psych: Negative for depression, anxiety, memory problems  Objective  Physical Exam Vitals:   01/20/23 1415  BP: 118/64  Pulse: 72  Temp: 98.6 F (37 C)  SpO2: 98%    BP Readings from Last 3 Encounters:  01/20/23 118/64  10/10/21 103/61   05/25/19 132/81   Wt Readings from Last 3 Encounters:  01/20/23 229 lb 9.6 oz (104.1 kg)  10/10/21 230 lb (104.3 kg)  05/25/19 235 lb (106.6 kg)    Physical Exam Constitutional:      General: He is not in acute distress.    Appearance: Normal appearance.  HENT:     Head: Normocephalic.     Right Ear: Tympanic membrane normal.     Left Ear: Tympanic membrane normal.     Nose: Nose normal.     Mouth/Throat:     Mouth: Mucous membranes are moist.     Pharynx: Oropharynx is clear.  Eyes:     Conjunctiva/sclera: Conjunctivae normal.     Pupils: Pupils are equal, round, and reactive to light.  Neck:     Thyroid: No thyromegaly.  Cardiovascular:     Rate and Rhythm: Normal rate and regular rhythm.     Heart sounds: Normal heart sounds.  Pulmonary:     Effort: Pulmonary effort is normal.     Breath sounds: Normal breath sounds.  Abdominal:     General: Abdomen is flat. Bowel sounds are normal.     Palpations: Abdomen is soft. There is no mass.     Tenderness: There is no abdominal tenderness.  Musculoskeletal:        General: Normal range of motion.  Lymphadenopathy:     Cervical: No cervical adenopathy.  Skin:    General: Skin is warm and dry.     Findings: No rash.  Neurological:     General: No focal deficit present.     Mental Status: He is alert.  Psychiatric:        Mood and Affect: Mood normal.        Behavior: Behavior normal.    Assessment/Plan:   Preventative health care Assessment & Plan: Physical exam complete. Lab work as outlined. Patient will return for fasting labs, will contact him with results. Colonoscopy- UTD. PSA lab ordered. Flu vaccine- UTD. Patient unsure of last tetanus, will look into and return if needed. Interested in Shingles vaccine, recommended patient get, information provided to patient. HIV/Hep C screenings ordered. Recommended annual follow ups with Dentist and Ophthalmology. Encouraged to continue healthy diet and begin exercising.    Orders: -     Lipid panel; Future -     PSA; Future -     TSH; Future -     CBC with Differential/Platelet; Future -     Basic metabolic panel; Future  Spherocytosis ([place]) Assessment & Plan: Hereditary. Will refer to Hematology.   Orders: -     CBC with Differential/Platelet; Future -     Ambulatory referral to Hematology / Oncology  Seizure-like activity [facility]) Assessment & Plan: Chronic. No new seizures or seizure like activity since 2005. Continue Lamictal 350 mg daily. Follow up with Neurology.    Chronic ITP (idiopathic thrombocytopenia) (HCC) Assessment & Plan:  Chronic. Denies any bleeding problems. Lab work as outlined. Will check CBC. Refer to Hematology.   Orders: -     CBC with Differential/Platelet; Future -     Ambulatory referral to Hematology / Oncology  Elevated bilirubin Assessment & Plan: Had abdominal US in May 2023 with no further work up. Denies any abdominal pains. Will check hepatic function panel. If continues to be elevated will proceed with additional imaging and refer to GI.   Orders: -     Hepatic function panel; Future  Encounter for hepatitis C screening test for low risk patient -     Hepatitis C antibody; Future  Encounter for screening for HIV -     HIV Antibody (routine testing w rflx); Future  Screening PSA (prostate specific antigen) -     PSA; Future  Thyroid disorder screen -     TSH; Future  Lipid screening -     Lipid panel; Future   Return in about 6 months (around 07/23/2023) for Follow up.   Tomasita Morrow, NP-C [place]

## 2023-01-20 NOTE — Assessment & Plan Note (Signed)
Chronic. Denies any bleeding problems. Lab work as outlined. Will check CBC. Refer to Hematology.

## 2023-01-20 NOTE — Assessment & Plan Note (Signed)
Physical exam complete. Lab work as outlined. Patient will return for fasting labs, will contact him with results. Colonoscopy- UTD. PSA lab ordered. Flu vaccine- UTD. Patient unsure of last tetanus, will look into and return if needed. Interested in Shingles vaccine, recommended patient get, information provided to patient. HIV/Hep C screenings ordered. Recommended annual follow ups with Dentist and Ophthalmology. Encouraged to continue healthy diet and begin exercising.

## 2023-02-02 ENCOUNTER — Telehealth: Payer: Self-pay | Admitting: Nurse Practitioner

## 2023-02-02 NOTE — Telephone Encounter (Signed)
MyChart message from patient,  I was looking at the lab tests to be run and wanted to make sure I was scheduled for a fasting blood glucose test.

## 2023-02-02 NOTE — Telephone Encounter (Signed)
Informed him via [employer]

## 2023-02-03 ENCOUNTER — Encounter: Payer: Self-pay | Admitting: Oncology

## 2023-02-03 ENCOUNTER — Other Ambulatory Visit: Payer: Self-pay

## 2023-02-03 ENCOUNTER — Inpatient Hospital Stay: Payer: Commercial Managed Care - PPO

## 2023-02-03 ENCOUNTER — Inpatient Hospital Stay: Payer: Commercial Managed Care - PPO | Attending: Oncology | Admitting: Oncology

## 2023-02-03 VITALS — BP 105/71 | HR 61 | Temp 97.2°F | Resp 16 | Ht 67.5 in | Wt 229.0 lb

## 2023-02-03 DIAGNOSIS — D580 Hereditary spherocytosis: Secondary | ICD-10-CM | POA: Insufficient documentation

## 2023-02-03 DIAGNOSIS — Z809 Family history of malignant neoplasm, unspecified: Secondary | ICD-10-CM | POA: Diagnosis not present

## 2023-02-03 DIAGNOSIS — D649 Anemia, unspecified: Secondary | ICD-10-CM | POA: Insufficient documentation

## 2023-02-03 DIAGNOSIS — Z800 Family history of malignant neoplasm of digestive organs: Secondary | ICD-10-CM

## 2023-02-03 DIAGNOSIS — D696 Thrombocytopenia, unspecified: Secondary | ICD-10-CM | POA: Diagnosis not present

## 2023-02-03 DIAGNOSIS — R161 Splenomegaly, not elsewhere classified: Secondary | ICD-10-CM | POA: Diagnosis not present

## 2023-02-03 DIAGNOSIS — F17210 Nicotine dependence, cigarettes, uncomplicated: Secondary | ICD-10-CM | POA: Diagnosis not present

## 2023-02-03 LAB — BILIRUBIN, TOTAL AND DIRECT (CANCER CENTER ONLY)
Bilirubin, Direct: 0.2 mg/dL (ref 0.0–0.2)
Indirect Bilirubin: 2.3 mg/dL — ABNORMAL HIGH (ref 0.3–0.9)
Total Bilirubin: 2.5 mg/dL — ABNORMAL HIGH (ref 0.3–1.2)

## 2023-02-03 LAB — IRON AND TIBC
Iron: 112 ug/dL (ref 45–182)
Saturation Ratios: 36 % (ref 17.9–39.5)
TIBC: 315 ug/dL (ref 250–450)
UIBC: 203 ug/dL

## 2023-02-03 LAB — RETICULOCYTES
Immature Retic Fract: 10.1 % (ref 2.3–15.9)
RBC.: 3.66 MIL/uL — ABNORMAL LOW (ref 4.22–5.81)
Retic Count, Absolute: 229.8 10*3/uL — ABNORMAL HIGH (ref 19.0–186.0)
Retic Ct Pct: 6.3 % — ABNORMAL HIGH (ref 0.4–3.1)

## 2023-02-03 LAB — LACTATE DEHYDROGENASE: LDH: 123 U/L (ref 98–192)

## 2023-02-03 LAB — CBC (CANCER CENTER ONLY)
HCT: 32.6 % — ABNORMAL LOW (ref 39.0–52.0)
Hemoglobin: 11.3 g/dL — ABNORMAL LOW (ref 13.0–17.0)
MCH: 31.6 pg (ref 26.0–34.0)
MCHC: 34.7 g/dL (ref 30.0–36.0)
MCV: 91.1 fL (ref 80.0–100.0)
Platelet Count: 143 10*3/uL — ABNORMAL LOW (ref 150–400)
RBC: 3.58 MIL/uL — ABNORMAL LOW (ref 4.22–5.81)
RDW: 18 % — ABNORMAL HIGH (ref 11.5–15.5)
WBC Count: 8.6 10*3/uL (ref 4.0–10.5)
nRBC: 0 % (ref 0.0–0.2)

## 2023-02-03 LAB — FERRITIN: Ferritin: 240 ng/mL (ref 24–336)

## 2023-02-03 LAB — FOLATE: Folate: 11.6 ng/mL (ref >5.9)

## 2023-02-03 LAB — DAT, POLYSPECIFIC AHG (ARMC ONLY): Polyspecific AHG test: NEGATIVE

## 2023-02-03 NOTE — Progress Notes (Signed)
[place]  Telephone:([phone]) [phone] [phone]) [phone]  ID: Colton Perez OB: [birth year]  MR#: [id number]  [id number]  Patient Care Team: Tomasita Morrow, NP as PCP - General (Nurse Practitioner)  CHIEF COMPLAINT: Spherocytosis, anemia and thrombocytopenia.  INTERVAL HISTORY: Patient is a 52 year old male who was last seen in clinic greater than 6 years ago who was referred back for further evaluation of his chronic anemia and thrombocytopenia.  He currently feels well and is asymptomatic.  He has no neurologic complaints.  He denies any recent fevers or illnesses.  He has a good appetite and denies weight loss.  He has no chest pain, shortness of breath, cough, or hemoptysis.  He denies any nausea, vomiting, constipation, or diarrhea.  He has no melena or hematochezia.  He has no urinary complaints.  Patient feels at his baseline and offers no specific complaints today.  REVIEW OF SYSTEMS:   Review of Systems  Constitutional: Negative.  Negative for fever, malaise/fatigue and weight loss.  Respiratory: Negative.  Negative for cough, hemoptysis and shortness of breath.   Cardiovascular: Negative.  Negative for chest pain and leg swelling.  Gastrointestinal: Negative.  Negative for abdominal pain, blood in stool and melena.  Genitourinary: Negative.  Negative for hematuria.  Musculoskeletal: Negative.   Skin: Negative.  Negative for rash.  Neurological: Negative.  Negative for dizziness, focal weakness, weakness and headaches.  Psychiatric/Behavioral: Negative.  The patient is not nervous/anxious.     As per HPI. Otherwise, a complete review of systems is negative.  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     Dysplastic nevus 08/01/2020   L back left of midline - mod   GERD (gastroesophageal reflux disease)    Seizures (HCC)     Spherocytosis ([place])     PAST SURGICAL HISTORY: Past Surgical History:  Procedure Laterality Date   CHOLECYSTECTOMY N/A 07/28/2018   Procedure: LAPAROSCOPIC CHOLECYSTECTOMY WITH INTRAOPERATIVE CHOLANGIOGRAM ERAS PATHWAY;  Surgeon: Donnie Mesa, MD;  Location: [place];  Service: General;  Laterality: N/A;   COLONOSCOPY WITH PROPOFOL N/A 10/10/2021   Procedure: COLONOSCOPY WITH PROPOFOL;  Surgeon: Lesly Rubenstein, MD;  Location: [facility];  Service: Endoscopy;  Laterality: N/A;  REQUESTS NO STUDENT CRNA   CYST EXCISION PERINEAL N/A 07/28/2018   Procedure: CYST EXCISION PERINEAL;  Surgeon: Donnie Mesa, MD;  Location: [place];  Service: General;  Laterality: N/A;   PILONIDAL CYST EXCISION  1996   TESTICULAR EXPLORATION  1978   WISDOM TOOTH EXTRACTION      FAMILY HISTORY: Family History  Problem Relation Age of Onset   Hypertension Mother    Hyperlipidemia Mother    Asthma Mother    Depression Mother    Obesity Mother    Cancer Maternal Grandmother        pancreatic   Asthma Maternal Grandmother    Cancer Maternal Grandfather    Obesity Maternal Grandfather    Cancer Paternal Grandmother    COPD Paternal Grandmother    Cancer Paternal Uncle    Diabetes Maternal Uncle    Obesity Maternal Uncle    Obesity Maternal Uncle     ADVANCED DIRECTIVES (Y/N):  N  HEALTH MAINTENANCE: Social History   Tobacco Use   Smoking status: Every Day    Packs/day: 1.00    Years: 35.00  Additional pack years: 0.00    Total pack years: 35.00    Types: Cigarettes   Smokeless tobacco: Never  Vaping Use   Vaping Use: Never used  Substance Use Topics   Alcohol use: Yes    Alcohol/week: 1.0 standard drink of alcohol    Types: 1 Shots of liquor per week    Comment: rare   Drug use: Never     Colonoscopy:  PAP:  Bone density:  Lipid panel:  Allergies  Allergen Reactions   Asa [Aspirin] Swelling    SWELLING REACTION UNSPECIFIED     Current Outpatient Medications  Medication  Sig Dispense Refill   esomeprazole (NEXIUM) 40 MG capsule Take 1 capsule (40 mg total) by mouth once daily as needed 90 capsule 3   lamoTRIgine (LAMICTAL XR) 100 MG 24 hour tablet Take 1 tab by mouth once a day along with 200 mg XR tab to equal 300 mg daily. 90 tablet 3   lamoTRIgine (LAMICTAL XR) 50 MG 24 hour tablet Take 1 tab along with 300 mg lamictal to total 350 mg XR daily 90 tablet 3   LamoTRIgine 200 MG TB24 24 hour tablet Take 1 tab along with 100 mg tab to equal 300 mg daily/ 90 tablet 3   No current facility-administered medications for this visit.    OBJECTIVE: Vitals:   02/03/23 1119  BP: 105/71  Pulse: 61  Resp: 16  Temp: (!) 97.2 F (36.2 C)  SpO2: 100%     Body mass index is 35.34 kg/m.    ECOG FS:0 - Asymptomatic  General: Well-developed, well-nourished, no acute distress. Eyes: Pink conjunctiva, anicteric sclera. HEENT: Normocephalic, moist mucous membranes. Lungs: No audible wheezing or coughing. Heart: Regular rate and rhythm. Abdomen: Soft, nontender, no obvious distention. Musculoskeletal: No edema, cyanosis, or clubbing. Neuro: Alert, answering all questions appropriately. Cranial nerves grossly intact. Skin: No rashes or petechiae noted. Psych: Normal affect. Lymphatics: No cervical, calvicular, axillary or inguinal LAD.   LAB RESULTS:  Lab Results  Component Value Date   NA 141 07/20/2018   K 3.8 07/20/2018   CL 107 07/20/2018   CO2 25 07/20/2018   GLUCOSE 104 (H) 07/20/2018   BUN 16 07/20/2018   CREATININE 0.94 07/20/2018   CALCIUM 8.7 (L) 07/20/2018   PROT 7.1 06/16/2017   ALBUMIN 4.4 06/16/2017   AST 19 06/16/2017   ALT 16 (L) 06/16/2017   ALKPHOS 69 06/16/2017   BILITOT 1.4 (H) 06/16/2017   GFRNONAA >60 07/20/2018   GFRAA >60 07/20/2018    Lab Results  Component Value Date   WBC 6.8 07/20/2018   NEUTROABS 6.1 07/27/2017   HGB 10.4 (L) 07/20/2018   HCT 30.5 (L) 07/20/2018   MCV 97.1 07/20/2018   PLT 138 (L) 07/20/2018      STUDIES: No results found.  ASSESSMENT: Spherocytosis, anemia and thrombocytopenia.  PLAN:    Anemia: Chronic and unchanged.  Patient's most recent hemoglobin is 11.3.  He has an appropriately elevated reticulocyte count.  Polyspecific AHG is negative and LDH is within normal limits.  Patient has a mildly elevated indirect bilirubin level likely indicating there may be a low level of hemolysis occurring.  Iron panel, haptoglobin, B12 and folate are all pending at time of dictation.  No intervention is needed at this time.  Patient will have video-assisted telemedicine visit in 3 weeks to discuss his results.  Thrombocytopenia: Secondary to splenomegaly.  No intervention needed.   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  No intervention needed.  Patient  may have a baseline anemia secondary to this diagnosis. Splenomegaly: Abdominal ultrasound from Apr 01, 2022 revealed a markedly enlarged spleen measuring 20.4 cm.  No intervention is needed.  I spent a total of 45 minutes reviewing chart data, face-to-face evaluation with the patient, counseling and coordination of care as detailed above.   Patient expressed understanding and was in agreement with this plan. He also understands that He can call clinic at any time with any questions, concerns, or complaints.    Lloyd Huger, MD   02/03/2023 12:27 PM

## 2023-02-04 LAB — ERYTHROPOIETIN: Erythropoietin: 47.5 m[IU]/mL — ABNORMAL HIGH (ref 2.6–18.5)

## 2023-02-05 LAB — HAPTOGLOBIN: Haptoglobin: 31 mg/dL (ref 23–355)

## 2023-02-17 ENCOUNTER — Other Ambulatory Visit (INDEPENDENT_AMBULATORY_CARE_PROVIDER_SITE_OTHER): Payer: Commercial Managed Care - PPO

## 2023-02-17 DIAGNOSIS — Z13220 Encounter for screening for lipoid disorders: Secondary | ICD-10-CM

## 2023-02-17 DIAGNOSIS — D693 Immune thrombocytopenic purpura: Secondary | ICD-10-CM

## 2023-02-17 DIAGNOSIS — Z1159 Encounter for screening for other viral diseases: Secondary | ICD-10-CM | POA: Diagnosis not present

## 2023-02-17 DIAGNOSIS — R17 Unspecified jaundice: Secondary | ICD-10-CM

## 2023-02-17 DIAGNOSIS — D580 Hereditary spherocytosis: Secondary | ICD-10-CM

## 2023-02-17 DIAGNOSIS — Z125 Encounter for screening for malignant neoplasm of prostate: Secondary | ICD-10-CM

## 2023-02-17 DIAGNOSIS — Z1329 Encounter for screening for other suspected endocrine disorder: Secondary | ICD-10-CM

## 2023-02-17 DIAGNOSIS — Z114 Encounter for screening for human immunodeficiency virus [HIV]: Secondary | ICD-10-CM | POA: Diagnosis not present

## 2023-02-17 DIAGNOSIS — Z0000 Encounter for general adult medical examination without abnormal findings: Secondary | ICD-10-CM

## 2023-02-17 LAB — CBC WITH DIFFERENTIAL/PLATELET
Basophils Absolute: 0 10*3/uL (ref 0.0–0.1)
Basophils Relative: 0.3 % (ref 0.0–3.0)
Eosinophils Absolute: 0.2 10*3/uL (ref 0.0–0.7)
Eosinophils Relative: 2.2 % (ref 0.0–5.0)
HCT: 31.4 % — ABNORMAL LOW (ref 39.0–52.0)
Hemoglobin: 11.3 g/dL — ABNORMAL LOW (ref 13.0–17.0)
Lymphocytes Relative: 20.8 % (ref 12.0–46.0)
Lymphs Abs: 1.7 10*3/uL (ref 0.7–4.0)
MCHC: 35.9 g/dL (ref 30.0–36.0)
MCV: 91 fl (ref 78.0–100.0)
Monocytes Absolute: 0.5 10*3/uL (ref 0.1–1.0)
Monocytes Relative: 6.8 % (ref 3.0–12.0)
Neutro Abs: 5.6 10*3/uL (ref 1.4–7.7)
Neutrophils Relative %: 69.9 % (ref 43.0–77.0)
Platelets: 126 10*3/uL — ABNORMAL LOW (ref 150.0–400.0)
RBC: 3.45 Mil/uL — ABNORMAL LOW (ref 4.22–5.81)
RDW: 20.2 % — ABNORMAL HIGH (ref 11.5–15.5)
WBC: 8 10*3/uL (ref 4.0–10.5)

## 2023-02-17 LAB — HEPATIC FUNCTION PANEL
ALT: 18 U/L (ref 0–53)
AST: 15 U/L (ref 0–37)
Albumin: 4.1 g/dL (ref 3.5–5.2)
Alkaline Phosphatase: 66 U/L (ref 39–117)
Bilirubin, Direct: 0.5 mg/dL — ABNORMAL HIGH (ref 0.0–0.3)
Total Bilirubin: 2.6 mg/dL — ABNORMAL HIGH (ref 0.2–1.2)
Total Protein: 6.3 g/dL (ref 6.0–8.3)

## 2023-02-17 LAB — BASIC METABOLIC PANEL
BUN: 17 mg/dL (ref 6–23)
CO2: 28 mEq/L (ref 19–32)
Calcium: 8.9 mg/dL (ref 8.4–10.5)
Chloride: 104 mEq/L (ref 96–112)
Creatinine, Ser: 1.09 mg/dL (ref 0.40–1.50)
GFR: 79.21 mL/min (ref >60.00)
Glucose, Bld: 96 mg/dL (ref 70–99)
Potassium: 3.7 mEq/L (ref 3.5–5.1)
Sodium: 138 mEq/L (ref 135–145)

## 2023-02-17 LAB — PSA: PSA: 0.13 ng/mL (ref 0.10–4.00)

## 2023-02-17 LAB — LIPID PANEL
Cholesterol: 116 mg/dL (ref 0–200)
HDL: 28.7 mg/dL — ABNORMAL LOW (ref >39.00)
LDL Cholesterol: 68 mg/dL (ref 0–99)
NonHDL: 87.41
Total CHOL/HDL Ratio: 4
Triglycerides: 96 mg/dL (ref 0.0–149.0)
VLDL: 19.2 mg/dL (ref 0.0–40.0)

## 2023-02-17 LAB — TSH: TSH: 1.52 u[IU]/mL (ref 0.35–5.50)

## 2023-02-18 ENCOUNTER — Other Ambulatory Visit: Payer: Self-pay

## 2023-02-18 LAB — HIV ANTIBODY (ROUTINE TESTING W REFLEX): HIV 1&2 Ab, 4th Generation: NONREACTIVE

## 2023-02-18 LAB — HEPATITIS C ANTIBODY: Hepatitis C Ab: NONREACTIVE

## 2023-02-23 ENCOUNTER — Inpatient Hospital Stay: Payer: Commercial Managed Care - PPO | Attending: Oncology | Admitting: Oncology

## 2023-02-23 DIAGNOSIS — D580 Hereditary spherocytosis: Secondary | ICD-10-CM

## 2023-02-23 NOTE — Progress Notes (Signed)
[facility]  Telephone:([phone] Fax:(336) [phone]  ID: Colton Perez OB: 08-06-1971  MR#: [id number]  AOZ#:[id number]  Patient Care Team: Bethanie Dicker, NP as PCP - General (Nurse Practitioner)  I connected with Colton Perez on 02/23/23 at  3:30 PM EDT by video enabled telemedicine visit and verified that I am speaking with the correct person using two identifiers.   I discussed the limitations, risks, security and privacy concerns of performing an evaluation and management service by telemedicine and the availability of in-person appointments. I also discussed with the patient that there may be a patient responsible charge related to this service. The patient expressed understanding and agreed to proceed.   Other persons participating in the visit and their role in the encounter: Patient, MD.  Patient's location: Home. Provider's location: Clinic.  CHIEF COMPLAINT: Spherocytosis, anemia and thrombocytopenia.  INTERVAL HISTORY: Patient agreed to video assisted telemedicine visit for further evaluation and discussion of his laboratory results.  He continues to feel well and remains asymptomatic.  He has no neurologic complaints.  He denies any recent fevers or illnesses.  He has a good appetite and denies weight loss.  He has no chest pain, shortness of breath, cough, or hemoptysis.  He denies any nausea, vomiting, constipation, or diarrhea.  He has no melena or hematochezia.  He has no urinary complaints.  Patient offers no specific complaints today.  REVIEW OF SYSTEMS:   Review of Systems  Constitutional: Negative.  Negative for fever, malaise/fatigue and weight loss.  Respiratory: Negative.  Negative for cough, hemoptysis and shortness of breath.   Cardiovascular: Negative.  Negative for chest pain and leg swelling.  Gastrointestinal: Negative.  Negative for abdominal pain, blood in stool and melena.  Genitourinary: Negative.  Negative for hematuria.   Musculoskeletal: Negative.   Skin: Negative.  Negative for rash.  Neurological: Negative.  Negative for dizziness, focal weakness, weakness and headaches.  Psychiatric/Behavioral: Negative.  The patient is not nervous/anxious.     As per HPI. Otherwise, a complete review of systems is negative.  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     Dysplastic nevus 08/01/2020   L back left of midline - mod   GERD (gastroesophageal reflux disease)    Seizures (HCC)    Spherocytosis (HCC)     PAST SURGICAL HISTORY: Past Surgical History:  Procedure Laterality Date   CHOLECYSTECTOMY N/A 07/28/2018   Procedure: LAPAROSCOPIC CHOLECYSTECTOMY WITH INTRAOPERATIVE CHOLANGIOGRAM ERAS PATHWAY;  Surgeon: Manus Rudd, MD;  Location: [facility] OR;  Service: General;  Laterality: N/A;   COLONOSCOPY WITH PROPOFOL N/A 10/10/2021   Procedure: COLONOSCOPY WITH PROPOFOL;  Surgeon: Regis Bill, MD;  Location: [facility] ENDOSCOPY;  Service: Endoscopy;  Laterality: N/A;  REQUESTS NO STUDENT CRNA   CYST EXCISION PERINEAL N/A 07/28/2018   Procedure: CYST EXCISION PERINEAL;  Surgeon: Manus Rudd, MD;  Location: [facility] OR;  Service: General;  Laterality: N/A;   PILONIDAL CYST EXCISION  1996   TESTICULAR EXPLORATION  1978   WISDOM TOOTH EXTRACTION      FAMILY HISTORY: Family History  Problem Relation Age of Onset   Hypertension Mother    Hyperlipidemia Mother    Asthma Mother    Depression Mother    Obesity Mother    Cancer Maternal Grandmother  pancreatic   Asthma Maternal Grandmother    Cancer Maternal Grandfather    Obesity Maternal Grandfather    Cancer Paternal Grandmother    COPD Paternal Grandmother    Cancer Paternal Uncle    Diabetes Maternal Uncle    Obesity Maternal Uncle    Obesity Maternal Uncle     ADVANCED DIRECTIVES (Y/N):  N  HEALTH  MAINTENANCE: Social History   Tobacco Use   Smoking status: Every Day    Packs/day: 1.00    Years: 35.00    Additional pack years: 0.00    Total pack years: 35.00    Types: Cigarettes   Smokeless tobacco: Never  Vaping Use   Vaping Use: Never used  Substance Use Topics   Alcohol use: Yes    Alcohol/week: 1.0 standard drink of alcohol    Types: 1 Shots of liquor per week    Comment: rare   Drug use: Never     Colonoscopy:  PAP:  Bone density:  Lipid panel:  Allergies  Allergen Reactions   Asa [Aspirin] Swelling    SWELLING REACTION UNSPECIFIED     Current Outpatient Medications  Medication Sig Dispense Refill   esomeprazole (NEXIUM) 40 MG capsule Take 1 capsule (40 mg total) by mouth once daily as needed 90 capsule 3   lamoTRIgine (LAMICTAL XR) 100 MG 24 hour tablet Take 1 tab by mouth once a day along with 200 mg XR tab to equal 300 mg daily. 90 tablet 3   lamoTRIgine (LAMICTAL XR) 50 MG 24 hour tablet Take 1 tab along with 300 mg lamictal to total 350 mg XR daily 90 tablet 3   LamoTRIgine 200 MG TB24 24 hour tablet Take 1 tab along with 100 mg tab to equal 300 mg daily/ 90 tablet 3   No current facility-administered medications for this visit.    OBJECTIVE: There were no vitals filed for this visit.    There is no height or weight on file to calculate BMI.    ECOG FS:0 - Asymptomatic  General: Well-developed, well-nourished, no acute distress. HEENT: Normocephalic. Neuro: Alert, answering all questions appropriately. Cranial nerves grossly intact. Psych: Normal affect.  LAB RESULTS:  Lab Results  Component Value Date   NA 138 02/17/2023   K 3.7 02/17/2023   CL 104 02/17/2023   CO2 28 02/17/2023   GLUCOSE 96 02/17/2023   BUN 17 02/17/2023   CREATININE 1.09 02/17/2023   CALCIUM 8.9 02/17/2023   PROT 6.3 02/17/2023   ALBUMIN 4.1 02/17/2023   AST 15 02/17/2023   ALT 18 02/17/2023   ALKPHOS 66 02/17/2023   BILITOT 2.6 (H) 02/17/2023   GFRNONAA >60  07/20/2018   GFRAA >60 07/20/2018    Lab Results  Component Value Date   WBC 8.0 02/17/2023   NEUTROABS 5.6 02/17/2023   HGB 11.3 (L) 02/17/2023   HCT 31.4 (L) 02/17/2023   MCV 91.0 02/17/2023   PLT 126.0 (L) 02/17/2023     STUDIES: No results found.  ASSESSMENT: Spherocytosis, anemia and thrombocytopenia.  PLAN:    Anemia: Chronic and unchanged.  Patient's most recent hemoglobin is 11.3.  He has an appropriately elevated reticulocyte count.  Polyspecific AHG is negative and LDH is within normal limits.  Patient has a mildly elevated indirect bilirubin level likely indicating there may be a low level of chronic hemolysis occurring.  Iron panel, haptoglobin, B12 and folate are all within normal limits.  No intervention is needed at this time.  Patient does not require  biopsy.  No further follow-up is necessary.  Thrombocytopenia: Chronic and unchanged.  Secondary to splenomegaly.  Patient's platelet count has ranged between 118 and 143 since at least April 2015.  No intervention needed.   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  No intervention needed.  Patient likely has a low level of hemolysis and a baseline anemia secondary to this diagnosis. Splenomegaly: Abdominal ultrasound from Apr 01, 2022 revealed a markedly enlarged spleen measuring 20.4 cm.  No intervention is needed.  I provided 20 minutes of face-to-face video visit time during this encounter which included chart review, counseling, and coordination of care as documented above.   Patient expressed understanding and was in agreement with this plan. He also understands that He can call clinic at any time with any questions, concerns, or complaints.    Jeralyn Ruths, MD   02/23/2023 3:23 PM

## 2023-04-14 ENCOUNTER — Other Ambulatory Visit: Payer: Self-pay

## 2023-04-14 DIAGNOSIS — M545 Low back pain, unspecified: Secondary | ICD-10-CM | POA: Diagnosis not present

## 2023-04-14 DIAGNOSIS — R569 Unspecified convulsions: Secondary | ICD-10-CM | POA: Diagnosis not present

## 2023-04-14 DIAGNOSIS — D580 Hereditary spherocytosis: Secondary | ICD-10-CM | POA: Diagnosis not present

## 2023-04-14 MED ORDER — LAMOTRIGINE ER 100 MG PO TB24
100.0000 mg | ORAL_TABLET | Freq: Every day | ORAL | 3 refills | Status: DC
  Filled 2023-04-14 – 2023-08-17 (×2): qty 90, 90d supply, fill #0
  Filled 2023-12-28: qty 90, 90d supply, fill #1
  Filled 2024-04-08: qty 90, 90d supply, fill #2

## 2023-04-14 MED ORDER — LAMOTRIGINE ER 200 MG PO TB24
200.0000 mg | ORAL_TABLET | Freq: Every day | ORAL | 3 refills | Status: DC
  Filled 2023-04-14 – 2023-08-17 (×2): qty 90, 90d supply, fill #0
  Filled 2023-12-28: qty 90, 90d supply, fill #1
  Filled 2024-04-08: qty 90, 90d supply, fill #2

## 2023-04-14 MED ORDER — LAMOTRIGINE ER 50 MG PO TB24
50.0000 mg | ORAL_TABLET | Freq: Every day | ORAL | 3 refills | Status: DC
  Filled 2023-04-14 – 2023-08-17 (×2): qty 90, 90d supply, fill #0
  Filled 2023-12-28: qty 90, 90d supply, fill #1
  Filled 2024-04-08: qty 90, 90d supply, fill #2

## 2023-05-26 ENCOUNTER — Other Ambulatory Visit: Payer: Self-pay

## 2023-05-26 ENCOUNTER — Encounter: Payer: Self-pay | Admitting: Nurse Practitioner

## 2023-05-26 ENCOUNTER — Ambulatory Visit: Payer: Commercial Managed Care - PPO | Admitting: Nurse Practitioner

## 2023-05-26 ENCOUNTER — Other Ambulatory Visit
Admission: RE | Admit: 2023-05-26 | Discharge: 2023-05-26 | Disposition: A | Discharge status: Home / Self Care | Payer: Commercial Managed Care - PPO | Source: Ambulatory Visit | Attending: Nurse Practitioner | Admitting: Nurse Practitioner

## 2023-05-26 VITALS — BP 110/60 | HR 60 | Temp 97.7°F | Ht 67.5 in | Wt 236.2 lb

## 2023-05-26 DIAGNOSIS — A046 Enteritis due to Yersinia enterocolitica: Secondary | ICD-10-CM | POA: Diagnosis not present

## 2023-05-26 DIAGNOSIS — R197 Diarrhea, unspecified: Secondary | ICD-10-CM | POA: Insufficient documentation

## 2023-05-26 LAB — GASTROINTESTINAL PANEL BY PCR, STOOL (REPLACES STOOL CULTURE)
Adenovirus F40/41: NOT DETECTED
Astrovirus: NOT DETECTED
Campylobacter species: NOT DETECTED
Cryptosporidium: NOT DETECTED
Cyclospora cayetanensis: NOT DETECTED
Entamoeba histolytica: NOT DETECTED
Enteroaggregative E coli (EAEC): NOT DETECTED
Enteropathogenic E coli (EPEC): NOT DETECTED
Enterotoxigenic E coli (ETEC): NOT DETECTED
Giardia lamblia: NOT DETECTED
Norovirus GI/GII: DETECTED — AB
Plesimonas shigelloides: NOT DETECTED
Rotavirus A: NOT DETECTED
Salmonella species: NOT DETECTED
Sapovirus (I, II, IV, and V): NOT DETECTED
Shiga like toxin producing E coli (STEC): NOT DETECTED
Shigella/Enteroinvasive E coli (EIEC): NOT DETECTED
Vibrio cholerae: NOT DETECTED
Vibrio species: NOT DETECTED
Yersinia enterocolitica: DETECTED — AB

## 2023-05-26 LAB — CBC WITH DIFFERENTIAL/PLATELET
Basophils Absolute: 0 10*3/uL (ref 0.0–0.1)
Basophils Relative: 0.2 % (ref 0.0–3.0)
Eosinophils Absolute: 0.2 10*3/uL (ref 0.0–0.7)
Eosinophils Relative: 2.5 % (ref 0.0–5.0)
HCT: 29.6 % — ABNORMAL LOW (ref 39.0–52.0)
Hemoglobin: 10.2 g/dL — ABNORMAL LOW (ref 13.0–17.0)
Lymphocytes Relative: 25.4 % (ref 12.0–46.0)
Lymphs Abs: 1.7 10*3/uL (ref 0.7–4.0)
MCHC: 34.4 g/dL (ref 30.0–36.0)
MCV: 93.9 fl (ref 78.0–100.0)
Monocytes Absolute: 0.5 10*3/uL (ref 0.1–1.0)
Monocytes Relative: 7.9 % (ref 3.0–12.0)
Neutro Abs: 4.2 10*3/uL (ref 1.4–7.7)
Neutrophils Relative %: 64 % (ref 43.0–77.0)
Platelets: 127 10*3/uL — ABNORMAL LOW (ref 150.0–400.0)
RBC: 3.15 Mil/uL — ABNORMAL LOW (ref 4.22–5.81)
RDW: 20.7 % — ABNORMAL HIGH (ref 11.5–15.5)
WBC: 6.5 10*3/uL (ref 4.0–10.5)

## 2023-05-26 LAB — C DIFFICILE QUICK SCREEN W PCR REFLEX
C Diff antigen: NEGATIVE
C Diff interpretation: NOT DETECTED
C Diff toxin: NEGATIVE

## 2023-05-26 LAB — BASIC METABOLIC PANEL
BUN: 20 mg/dL (ref 6–23)
CO2: 28 mEq/L (ref 19–32)
Calcium: 9.2 mg/dL (ref 8.4–10.5)
Chloride: 107 mEq/L (ref 96–112)
Creatinine, Ser: 1.07 mg/dL (ref 0.40–1.50)
GFR: 80.84 mL/min (ref >60.00)
Glucose, Bld: 145 mg/dL — ABNORMAL HIGH (ref 70–99)
Potassium: 4.2 mEq/L (ref 3.5–5.1)
Sodium: 141 mEq/L (ref 135–145)

## 2023-05-26 MED ORDER — DICYCLOMINE HCL 10 MG PO CAPS
10.0000 mg | ORAL_CAPSULE | Freq: Three times a day (TID) | ORAL | 2 refills | Status: DC
Start: 2023-05-26 — End: 2023-05-26
  Filled 2023-05-26: qty 120, 30d supply, fill #0

## 2023-05-26 MED ORDER — SULFAMETHOXAZOLE-TRIMETHOPRIM 800-160 MG PO TABS
1.0000 | ORAL_TABLET | Freq: Two times a day (BID) | ORAL | 0 refills | Status: DC
Start: 2023-05-26 — End: 2023-07-08
  Filled 2023-05-26: qty 14, 7d supply, fill #0

## 2023-05-26 NOTE — Telephone Encounter (Signed)
Provider stated she spoke with pt in regards to this

## 2023-05-26 NOTE — Assessment & Plan Note (Addendum)
GI stool panel completed today, positive for Yersinia enterocolitica and Norovirus. Patient was contacted with the results. Will treat with Bactrim DS BID x 7 days. Encouraged adequate fluid intake. Advised frequent hand washing. Information provided to patient. Strict precautions given to patient, he will seek immediate medical attention if he develops high fevers, bloody stool or significant abdominal pain.

## 2023-05-26 NOTE — Assessment & Plan Note (Addendum)
Essentially resolved. No longer having diarrhea. Soft, formed BM yesterday. Abdominal pain resolved. Will check lab work as outlined. Will contact patient with results. Advised adequate fluid intake.

## 2023-05-26 NOTE — Progress Notes (Signed)
Bethanie Dicker, NP-C Phone: [phone]  Colton Perez is a 52 y.o. male who presents today for diarrhea.  Diarrhea: Patient complains of diarrhea. Onset of diarrhea was several weeks ago, worsening over the last week. He was having one episode of semisolid diarrhea daily until last Thursday where he began have up to 7 episodes of watery, foul smelling diarrhea. This lasted for 3 days. Patient describes diarrhea as having unusual odor, oily, and watery. Diarrhea has been associated with  mild generalized abdominal pain last week, denies today .  Patient denies blood in stool, fever, illness in household contacts, recent antibiotic use, recent camping, recent travel, significant abdominal pain.  Previous visits for diarrhea: none. Evaluation to date: none. Treatment to date: Lomotil. His last bowel movement was yesterday, formed but soft. Denies diarrhea today. He has been taking a prebiotic and probiotic daily for the last month. He is no longer having abdominal pain. He does feel bloated and gassy.   Social History   Tobacco Use  Smoking Status Every Day   Current packs/day: 1.00   Average packs/day: 1 pack/day for 35.0 years (35.0 ttl pk-yrs)   Types: Cigarettes  Smokeless Tobacco Never    Current Outpatient Medications on File Prior to Visit  Medication Sig Dispense Refill   esomeprazole (NEXIUM) 40 MG capsule Take 1 capsule (40 mg total) by mouth once daily as needed 90 capsule 3   lamoTRIgine (LAMICTAL XR) 100 MG 24 hour tablet Take 1 tab by mouth once a day along with 200 mg XR tab to equal 300 mg daily. 90 tablet 3   lamoTRIgine (LAMICTAL XR) 100 MG 24 hour tablet Take 1 tablet (100 mg total) by mouth daily along with 200 mg XR tab to equal 300 mg daily. 90 tablet 3   lamoTRIgine (LAMICTAL XR) 50 MG 24 hour tablet Take 1 tab along with 300 mg lamictal to total 350 mg XR daily 90 tablet 3   lamoTRIgine (LAMICTAL XR) 50 MG 24 hour tablet Take 1 tablet (50 mg total) by mouth daily along  with 300 mg lamictal to total 350 mg XR daily 90 tablet 3   LamoTRIgine 200 MG TB24 24 hour tablet Take 1 tab along with 100 mg tab to equal 300 mg daily/ 90 tablet 3   LamoTRIgine 200 MG TB24 24 hour tablet Take 1 tablet (200 mg total) by mouth daily along with 100 mg tab to equal 300 mg daily 90 tablet 3   No current facility-administered medications on file prior to visit.    ROS see history of present illness  Objective  Physical Exam Vitals:   05/26/23 0759  BP: 110/60  Pulse: 60  Temp: 97.7 F (36.5 C)  SpO2: 97%    BP Readings from Last 3 Encounters:  05/26/23 110/60  02/03/23 105/71  01/20/23 118/64   Wt Readings from Last 3 Encounters:  05/26/23 236 lb 3.2 oz (107.1 kg)  02/03/23 229 lb (103.9 kg)  01/20/23 229 lb 9.6 oz (104.1 kg)    Physical Exam Constitutional:      General: He is not in acute distress.    Appearance: Normal appearance.  HENT:     Head: Normocephalic.  Cardiovascular:     Rate and Rhythm: Normal rate and regular rhythm.     Heart sounds: Normal heart sounds.  Pulmonary:     Effort: Pulmonary effort is normal.     Breath sounds: Normal breath sounds.  Abdominal:     General: Abdomen is  flat. Bowel sounds are normal. There is no distension.     Palpations: Abdomen is soft.     Tenderness: There is no abdominal tenderness.  Skin:    General: Skin is warm and dry.  Neurological:     General: No focal deficit present.     Mental Status: He is alert.  Psychiatric:        Mood and Affect: Mood normal.        Behavior: Behavior normal.    Assessment/Plan: Please see individual problem list.  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 Patient was contacted with the results. Will treat with Bactrim DS BID x 7 days. Encouraged adequate fluid intake. Advised frequent hand washing. Information provided to patient. Strict precautions given to  patient, he will seek immediate medical attention if he develops high fevers, bloody stool or significant abdominal pain.   Orders: -     Sulfamethoxazole-Trimethoprim; Take 1 tablet by mouth 2 (two) times daily.  Dispense: 14 tablet; Refill: 0  Diarrhea of presumed infectious origin Assessment & Plan: Essentially resolved. No longer having diarrhea. Soft, formed BM yesterday. Abdominal pain resolved. Will check lab work as outlined. Will contact patient with results. Advised adequate fluid intake.   Orders: -     Gastrointestinal Panel by PCR , Stool; Future -     C Difficile Quick Screen w PCR reflex; Future -     CBC with Differential/Platelet -     Basic metabolic panel   Return if symptoms worsen or fail to improve.   Bethanie Dicker, NP-C [facility] [employer]

## 2023-05-27 ENCOUNTER — Other Ambulatory Visit: Payer: Self-pay

## 2023-06-02 ENCOUNTER — Other Ambulatory Visit: Payer: Self-pay | Admitting: *Deleted

## 2023-06-02 DIAGNOSIS — D580 Hereditary spherocytosis: Secondary | ICD-10-CM

## 2023-06-03 ENCOUNTER — Inpatient Hospital Stay: Payer: Commercial Managed Care - PPO | Attending: Oncology | Admitting: Oncology

## 2023-06-03 ENCOUNTER — Inpatient Hospital Stay: Payer: Commercial Managed Care - PPO

## 2023-06-03 ENCOUNTER — Encounter: Payer: Self-pay | Admitting: Oncology

## 2023-06-03 ENCOUNTER — Other Ambulatory Visit: Payer: Self-pay

## 2023-06-03 VITALS — BP 118/68 | HR 69 | Temp 97.4°F | Resp 16 | Ht 67.5 in | Wt 238.0 lb

## 2023-06-03 DIAGNOSIS — D580 Hereditary spherocytosis: Secondary | ICD-10-CM | POA: Diagnosis not present

## 2023-06-03 DIAGNOSIS — D6959 Other secondary thrombocytopenia: Secondary | ICD-10-CM | POA: Diagnosis not present

## 2023-06-03 DIAGNOSIS — R197 Diarrhea, unspecified: Secondary | ICD-10-CM | POA: Insufficient documentation

## 2023-06-03 DIAGNOSIS — R161 Splenomegaly, not elsewhere classified: Secondary | ICD-10-CM | POA: Diagnosis not present

## 2023-06-03 DIAGNOSIS — D649 Anemia, unspecified: Secondary | ICD-10-CM | POA: Insufficient documentation

## 2023-06-03 LAB — CBC WITH DIFFERENTIAL/PLATELET
Abs Immature Granulocytes: 0.16 10*3/uL — ABNORMAL HIGH (ref 0.00–0.07)
Basophils Absolute: 0.1 10*3/uL (ref 0.0–0.1)
Basophils Relative: 1 %
Eosinophils Absolute: 0.4 10*3/uL (ref 0.0–0.5)
Eosinophils Relative: 4 %
HCT: 30.5 % — ABNORMAL LOW (ref 39.0–52.0)
Hemoglobin: 10.8 g/dL — ABNORMAL LOW (ref 13.0–17.0)
Immature Granulocytes: 2 %
Lymphocytes Relative: 20 %
Lymphs Abs: 2 10*3/uL (ref 0.7–4.0)
MCH: 32.5 pg (ref 26.0–34.0)
MCHC: 35.4 g/dL (ref 30.0–36.0)
MCV: 91.9 fL (ref 80.0–100.0)
Monocytes Absolute: 0.7 10*3/uL (ref 0.1–1.0)
Monocytes Relative: 8 %
Neutro Abs: 6.5 10*3/uL (ref 1.7–7.7)
Neutrophils Relative %: 65 %
Platelets: 145 10*3/uL — ABNORMAL LOW (ref 150–400)
RBC: 3.32 MIL/uL — ABNORMAL LOW (ref 4.22–5.81)
RDW: 18.5 % — ABNORMAL HIGH (ref 11.5–15.5)
WBC: 9.8 10*3/uL (ref 4.0–10.5)
nRBC: 0 % (ref 0.0–0.2)

## 2023-06-03 LAB — IRON AND TIBC
Iron: 169 ug/dL (ref 45–182)
Saturation Ratios: 53 % — ABNORMAL HIGH (ref 17.9–39.5)
TIBC: 319 ug/dL (ref 250–450)
UIBC: 150 ug/dL

## 2023-06-03 LAB — BILIRUBIN, FRACTIONATED(TOT/DIR/INDIR)
Bilirubin, Direct: 0.3 mg/dL — ABNORMAL HIGH (ref 0.0–0.2)
Indirect Bilirubin: 2.3 mg/dL — ABNORMAL HIGH (ref 0.3–0.9)
Total Bilirubin: 2.6 mg/dL — ABNORMAL HIGH (ref 0.3–1.2)

## 2023-06-03 LAB — LACTATE DEHYDROGENASE: LDH: 142 U/L (ref 98–192)

## 2023-06-03 LAB — FERRITIN: Ferritin: 232 ng/mL (ref 24–336)

## 2023-06-03 NOTE — Progress Notes (Signed)
[facility]  Telephone:(336) [phone] Fax:(336) [phone]  ID: Colton Perez OB: 05-16-1971  MR#: [id number]  IEP#:[id number]  Patient Care Team: Bethanie Dicker, NP as PCP - General (Nurse Practitioner)  CHIEF COMPLAINT: Spherocytosis, anemia and thrombocytopenia.  INTERVAL HISTORY: Patient was referred back to clinic by primary care for worsening anemia.  He recently had a infectious diarrhea and is currently on Bactrim, but states his last dose is tomorrow.  His diarrhea has improved and he is now back to his baseline.  He does not complain of any weakness or fatigue.  He has no neurologic complaints. He has a good appetite and denies weight loss.  He has no chest pain, shortness of breath, cough, or hemoptysis.  He denies any nausea, vomiting, or constipation.  He has no melena or hematochezia.  He has no urinary complaints.  Patient offers no further specific complaints today.  REVIEW OF SYSTEMS:   Review of Systems  Constitutional: Negative.  Negative for fever, malaise/fatigue and weight loss.  Respiratory: Negative.  Negative for cough, hemoptysis and shortness of breath.   Cardiovascular: Negative.  Negative for chest pain and leg swelling.  Gastrointestinal: Negative.  Negative for abdominal pain, blood in stool and melena.  Genitourinary: Negative.  Negative for hematuria.  Musculoskeletal: Negative.   Skin: Negative.  Negative for rash.  Neurological: Negative.  Negative for dizziness, focal weakness, weakness and headaches.  Psychiatric/Behavioral: Negative.  The patient is not nervous/anxious.     As per HPI. Otherwise, a complete review of systems is negative.  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     Dysplastic nevus 08/01/2020   L back left of midline - mod   GERD (gastroesophageal reflux disease)    Seizures (HCC)     Spherocytosis (HCC)     PAST SURGICAL HISTORY: Past Surgical History:  Procedure Laterality Date   CHOLECYSTECTOMY N/A 07/28/2018   Procedure: LAPAROSCOPIC CHOLECYSTECTOMY WITH INTRAOPERATIVE CHOLANGIOGRAM ERAS PATHWAY;  Surgeon: Manus Rudd, MD;  Location: [facility] OR;  Service: General;  Laterality: N/A;   COLONOSCOPY WITH PROPOFOL N/A 10/10/2021   Procedure: COLONOSCOPY WITH PROPOFOL;  Surgeon: Regis Bill, MD;  Location: [facility] ENDOSCOPY;  Service: Endoscopy;  Laterality: N/A;  REQUESTS NO STUDENT CRNA   CYST EXCISION PERINEAL N/A 07/28/2018   Procedure: CYST EXCISION PERINEAL;  Surgeon: Manus Rudd, MD;  Location: [facility] OR;  Service: General;  Laterality: N/A;   PILONIDAL CYST EXCISION  1996   TESTICULAR EXPLORATION  1978   WISDOM TOOTH EXTRACTION      FAMILY HISTORY: Family History  Problem Relation Age of Onset   Hypertension Mother    Hyperlipidemia Mother    Asthma Mother    Depression Mother    Obesity Mother    Cancer Maternal Grandmother        pancreatic   Asthma Maternal Grandmother    Cancer Maternal Grandfather    Obesity Maternal Grandfather    Cancer Paternal Grandmother    COPD Paternal Grandmother    Cancer Paternal Uncle    Diabetes Maternal Uncle    Obesity Maternal Uncle    Obesity Maternal Uncle     ADVANCED DIRECTIVES (Y/N):  N  HEALTH MAINTENANCE: Social History   Tobacco Use   Smoking status: Every Day    Current packs/day: 1.00  Average packs/day: 1 pack/day for 35.0 years (35.0 ttl pk-yrs)    Types: Cigarettes   Smokeless tobacco: Never  Vaping Use   Vaping status: Never Used  Substance Use Topics   Alcohol use: Yes    Alcohol/week: 1.0 standard drink of alcohol    Types: 1 Shots of liquor per week    Comment: rare   Drug use: Never     Colonoscopy:  PAP:  Bone density:  Lipid panel:  Allergies  Allergen Reactions   Asa [Aspirin] Swelling    SWELLING REACTION UNSPECIFIED     Current Outpatient Medications   Medication Sig Dispense Refill   esomeprazole (NEXIUM) 40 MG capsule Take 1 capsule (40 mg total) by mouth once daily as needed 90 capsule 3   lamoTRIgine (LAMICTAL XR) 100 MG 24 hour tablet Take 1 tab by mouth once a day along with 200 mg XR tab to equal 300 mg daily. 90 tablet 3   lamoTRIgine (LAMICTAL XR) 100 MG 24 hour tablet Take 1 tablet (100 mg total) by mouth daily along with 200 mg XR tab to equal 300 mg daily. 90 tablet 3   lamoTRIgine (LAMICTAL XR) 50 MG 24 hour tablet Take 1 tab along with 300 mg lamictal to total 350 mg XR daily 90 tablet 3   lamoTRIgine (LAMICTAL XR) 50 MG 24 hour tablet Take 1 tablet (50 mg total) by mouth daily along with 300 mg lamictal to total 350 mg XR daily 90 tablet 3   LamoTRIgine 200 MG TB24 24 hour tablet Take 1 tab along with 100 mg tab to equal 300 mg daily/ 90 tablet 3   LamoTRIgine 200 MG TB24 24 hour tablet Take 1 tablet (200 mg total) by mouth daily along with 100 mg tab to equal 300 mg daily 90 tablet 3   sulfamethoxazole-trimethoprim (BACTRIM DS) 800-160 MG tablet Take 1 tablet by mouth 2 (two) times daily. 14 tablet 0   No current facility-administered medications for this visit.    OBJECTIVE: Vitals:   06/03/23 1515  BP: 118/68  Pulse: 69  Resp: 16  Temp: (!) 97.4 F (36.3 C)  SpO2: 100%      Body mass index is 36.73 kg/m.    ECOG FS:0 - Asymptomatic  General: Well-developed, well-nourished, no acute distress. Eyes: Pink conjunctiva, anicteric sclera. HEENT: Normocephalic, moist mucous membranes. Lungs: No audible wheezing or coughing. Heart: Regular rate and rhythm. Abdomen: Soft, nontender, no obvious distention. Musculoskeletal: No edema, cyanosis, or clubbing. Neuro: Alert, answering all questions appropriately. Cranial nerves grossly intact. Skin: No rashes or petechiae noted. Psych: Normal affect.  LAB RESULTS:  Lab Results  Component Value Date   NA 141 05/26/2023   K 4.2 05/26/2023   CL 107 05/26/2023   CO2 28  05/26/2023   GLUCOSE 145 (H) 05/26/2023   BUN 20 05/26/2023   CREATININE 1.07 05/26/2023   CALCIUM 9.2 05/26/2023   PROT 6.3 02/17/2023   ALBUMIN 4.1 02/17/2023   AST 15 02/17/2023   ALT 18 02/17/2023   ALKPHOS 66 02/17/2023   BILITOT 2.6 (H) 06/03/2023   GFRNONAA >60 07/20/2018   GFRAA >60 07/20/2018    Lab Results  Component Value Date   WBC 9.8 06/03/2023   NEUTROABS 6.5 06/03/2023   HGB 10.8 (L) 06/03/2023   HCT 30.5 (L) 06/03/2023   MCV 91.9 06/03/2023   PLT 145 (L) 06/03/2023   Lab Results  Component Value Date   IRON 169 06/03/2023   TIBC 319 06/03/2023  IRONPCTSAT 53 (H) 06/03/2023   Lab Results  Component Value Date   FERRITIN 232 06/03/2023     STUDIES: No results found.  ASSESSMENT: Spherocytosis, anemia and thrombocytopenia.  PLAN:    Anemia: Patient's hemoglobin initially decreased to 10.2, but now is trending back up and is 10.8.  His baseline hemoglobin is between 11.0 and 11.5.  Patient is known to have a likely low-grade hemolysis secondary to his spherocytosis and it is possible that he had an increased amount of hemolysis with his acute infection.  LDH is within normal limits.  His indirect bilirubin remains chronically elevated at 2.3 which again is approximately his baseline.  Iron stores are within normal limits.  No intervention is needed at this time.  No further follow-up has been scheduled. Thrombocytopenia: Chronic and unchanged.  Secondary to splenomegaly.  Patient's platelet count has ranged between 118 and 145 Since at least April 2015.  Today result is 145.  No intervention needed. 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  No intervention needed.  Patient likely has a low level of hemolysis and a baseline anemia secondary to this diagnosis. Splenomegaly: Abdominal ultrasound from Apr 01, 2022 revealed a markedly enlarged spleen measuring 20.4 cm.  No intervention is needed. Diarrhea: Patient reports he will complete his Bactrim dosing  tomorrow.   Patient expressed understanding and was in agreement with this plan. He also understands that He can call clinic at any time with any questions, concerns, or complaints.    Jeralyn Ruths, MD   06/03/2023 5:16 PM

## 2023-07-01 ENCOUNTER — Encounter: Payer: Self-pay | Admitting: Nurse Practitioner

## 2023-07-02 ENCOUNTER — Ambulatory Visit: Payer: Commercial Managed Care - PPO | Admitting: Nurse Practitioner

## 2023-07-02 ENCOUNTER — Encounter: Payer: Self-pay | Admitting: Nurse Practitioner

## 2023-07-02 VITALS — BP 110/60 | HR 66 | Temp 98.2°F | Ht 67.5 in | Wt 240.2 lb

## 2023-07-02 DIAGNOSIS — R197 Diarrhea, unspecified: Secondary | ICD-10-CM | POA: Diagnosis not present

## 2023-07-02 NOTE — Progress Notes (Unsigned)
Bethanie Dicker, NP-C Phone: [phone]  Colton Perez is a 52 y.o. male who presents today for loose stools.   Patient with loose stool x 2 days. He is concerned that his colitis has returned. He had similar symptoms last month, his stool at that time was positive for Yersinia and Norovirus. His symptoms resolved last month after treatment with Bactrim then returned yesterday. He is requesting to repeat stool testing. He had 3 episodes of loose stool yesterday and 2 episodes today. He reports it has the same foul odor as last month. He has noticed an increase in gas the last 2 days. Denies nausea and vomiting. Denies fevers/chills. Denies abdominal pain.  Social History   Tobacco Use  Smoking Status Every Day   Current packs/day: 1.00   Average packs/day: 1 pack/day for 35.0 years (35.0 ttl pk-yrs)   Types: Cigarettes  Smokeless Tobacco Never    Current Outpatient Medications on File Prior to Visit  Medication Sig Dispense Refill   esomeprazole (NEXIUM) 40 MG capsule Take 1 capsule (40 mg total) by mouth once daily as needed 90 capsule 3   lamoTRIgine (LAMICTAL XR) 100 MG 24 hour tablet Take 1 tab by mouth once a day along with 200 mg XR tab to equal 300 mg daily. 90 tablet 3   lamoTRIgine (LAMICTAL XR) 100 MG 24 hour tablet Take 1 tablet (100 mg total) by mouth daily along with 200 mg XR tab to equal 300 mg daily. 90 tablet 3   lamoTRIgine (LAMICTAL XR) 50 MG 24 hour tablet Take 1 tab along with 300 mg lamictal to total 350 mg XR daily 90 tablet 3   lamoTRIgine (LAMICTAL XR) 50 MG 24 hour tablet Take 1 tablet (50 mg total) by mouth daily along with 300 mg lamictal to total 350 mg XR daily 90 tablet 3   LamoTRIgine 200 MG TB24 24 hour tablet Take 1 tab along with 100 mg tab to equal 300 mg daily/ 90 tablet 3   LamoTRIgine 200 MG TB24 24 hour tablet Take 1 tablet (200 mg total) by mouth daily along with 100 mg tab to equal 300 mg daily 90 tablet 3   sulfamethoxazole-trimethoprim (BACTRIM  DS) 800-160 MG tablet Take 1 tablet by mouth 2 (two) times daily. 14 tablet 0   No current facility-administered medications on file prior to visit.    ROS see history of present illness  Objective  Physical Exam Vitals:   07/02/23 1544  BP: 110/60  Pulse: 66  Temp: 98.2 F (36.8 C)  SpO2: 99%    BP Readings from Last 3 Encounters:  07/02/23 110/60  06/03/23 118/68  05/26/23 110/60   Wt Readings from Last 3 Encounters:  07/02/23 240 lb 3.2 oz (109 kg)  06/03/23 238 lb (108 kg)  05/26/23 236 lb 3.2 oz (107.1 kg)    Physical Exam Constitutional:      General: He is not in acute distress.    Appearance: Normal appearance.  HENT:     Head: Normocephalic.  Cardiovascular:     Rate and Rhythm: Normal rate and regular rhythm.     Heart sounds: Normal heart sounds.  Pulmonary:     Effort: Pulmonary effort is normal.     Breath sounds: Normal breath sounds.  Abdominal:     General: Abdomen is flat. Bowel sounds are normal.     Palpations: Abdomen is soft.     Tenderness: There is no abdominal tenderness.  Skin:    General: Skin  is warm and dry.  Neurological:     General: No focal deficit present.     Mental Status: He is alert.  Psychiatric:        Mood and Affect: Mood normal.        Behavior: Behavior normal.    Assessment/Plan: Please see individual problem list.  Diarrhea of presumed infectious origin Assessment & Plan: Foul smelling loose stool x 2 days, 5 episodes total. Will check stool GI panel and contact patient with results. Advised adequate fluid intake. Return precautions given to patient.   Orders: -     Gastrointestinal Panel by PCR , Stool; Future   Return if symptoms worsen or fail to improve.   Bethanie Dicker, NP-C [facility] - [employer]

## 2023-07-08 ENCOUNTER — Encounter: Payer: Self-pay | Admitting: Nurse Practitioner

## 2023-07-08 NOTE — Assessment & Plan Note (Signed)
Foul smelling loose stool x 2 days, 5 episodes total. Will check stool GI panel and contact patient with results. Advised adequate fluid intake. Return precautions given to patient.

## 2023-08-10 ENCOUNTER — Other Ambulatory Visit: Payer: Self-pay

## 2023-08-10 MED ORDER — AMOXICILLIN 500 MG PO CAPS
500.0000 mg | ORAL_CAPSULE | Freq: Four times a day (QID) | ORAL | 0 refills | Status: DC
  Filled 2023-08-10: qty 30, 8d supply, fill #0

## 2023-08-17 ENCOUNTER — Other Ambulatory Visit: Payer: Self-pay

## 2023-08-18 ENCOUNTER — Other Ambulatory Visit: Payer: Self-pay

## 2023-08-26 ENCOUNTER — Encounter: Payer: 59 | Admitting: Dermatology

## 2023-08-30 ENCOUNTER — Encounter: Payer: 59 | Admitting: Dermatology

## 2023-09-13 ENCOUNTER — Ambulatory Visit (INDEPENDENT_AMBULATORY_CARE_PROVIDER_SITE_OTHER): Payer: Commercial Managed Care - PPO | Admitting: Dermatology

## 2023-09-13 ENCOUNTER — Encounter: Payer: Self-pay | Admitting: Dermatology

## 2023-09-13 DIAGNOSIS — L814 Other melanin hyperpigmentation: Secondary | ICD-10-CM | POA: Diagnosis not present

## 2023-09-13 DIAGNOSIS — Z86018 Personal history of other benign neoplasm: Secondary | ICD-10-CM | POA: Diagnosis not present

## 2023-09-13 DIAGNOSIS — L821 Other seborrheic keratosis: Secondary | ICD-10-CM | POA: Diagnosis not present

## 2023-09-13 DIAGNOSIS — D229 Melanocytic nevi, unspecified: Secondary | ICD-10-CM

## 2023-09-13 DIAGNOSIS — L578 Other skin changes due to chronic exposure to nonionizing radiation: Secondary | ICD-10-CM | POA: Diagnosis not present

## 2023-09-13 DIAGNOSIS — Z1283 Encounter for screening for malignant neoplasm of skin: Secondary | ICD-10-CM | POA: Diagnosis not present

## 2023-09-13 DIAGNOSIS — D1801 Hemangioma of skin and subcutaneous tissue: Secondary | ICD-10-CM

## 2023-09-13 DIAGNOSIS — W908XXA Exposure to other nonionizing radiation, initial encounter: Secondary | ICD-10-CM

## 2023-09-13 DIAGNOSIS — L570 Actinic keratosis: Secondary | ICD-10-CM

## 2023-09-13 NOTE — Progress Notes (Signed)
Follow-Up Visit   Subjective  Colton Perez is a 52 y.o. male who presents for the following: Skin Cancer Screening and Full Body Skin Exam  The patient presents for Total-Body Skin Exam (TBSE) for skin cancer screening and mole check. The patient has spots, moles and lesions to be evaluated, some may be new or changing and the patient may have concern these could be cancer.  Pt with hx of dysplastic nevi.   The following portions of the chart were reviewed this encounter and updated as appropriate: medications, allergies, medical history  Review of Systems:  No other skin or systemic complaints except as noted in HPI or Assessment and Plan.  Objective  Well appearing patient in no apparent distress; mood and affect are within normal limits.  A full examination was performed including scalp, head, eyes, ears, nose, lips, neck, chest, axillae, abdomen, back, buttocks, bilateral upper extremities, bilateral lower extremities, hands, feet, fingers, toes, fingernails, and toenails. All findings within normal limits unless otherwise noted below.   Relevant physical exam findings are noted in the Assessment and Plan.  Right Temple Erythematous thin papules/macules with gritty scale.     Assessment & Plan   SKIN CANCER SCREENING PERFORMED TODAY.  ACTINIC DAMAGE - Chronic condition, secondary to cumulative UV/sun exposure - diffuse scaly erythematous macules with underlying dyspigmentation - Recommend daily broad spectrum sunscreen SPF 30+ to sun-exposed areas, reapply every 2 hours as needed.  - Staying in the shade or wearing long sleeves, sun glasses (UVA+UVB protection) and wide brim hats (4-inch brim around the entire circumference of the hat) are also recommended for sun protection.  - Call for new or changing lesions.  LENTIGINES, SEBORRHEIC KERATOSES, HEMANGIOMAS - Benign normal skin lesions - Benign-appearing - Call for any changes  MELANOCYTIC NEVI - Tan-brown and/or  pink-flesh-colored symmetric macules and papules - Benign appearing on exam today - Observation - Call clinic for new or changing moles - Recommend daily use of broad spectrum spf 30+ sunscreen to sun-exposed areas.   History of Dysplastic Nevi. Left back, left of midline. Moderate. 08/01/2020. - No evidence of recurrence today - Recommend regular full body skin exams - Recommend daily broad spectrum sunscreen SPF 30+ to sun-exposed areas, reapply every 2 hours as needed.  - Call if any new or changing lesions are noted between office visits     AK (actinic keratosis) Right Temple  Actinic keratoses are precancerous spots that appear secondary to cumulative UV radiation exposure/sun exposure over time. They are chronic with expected duration over 1 year. A portion of actinic keratoses will progress to squamous cell carcinoma of the skin. It is not possible to reliably predict which spots will progress to skin cancer and so treatment is recommended to prevent development of skin cancer.  Recommend daily broad spectrum sunscreen SPF 30+ to sun-exposed areas, reapply every 2 hours as needed.  Recommend staying in the shade or wearing long sleeves, sun glasses (UVA+UVB protection) and wide brim hats (4-inch brim around the entire circumference of the hat). Call for new or changing lesions.   Destruction of lesion - Right Temple Complexity: simple   Destruction method: cryotherapy   Informed consent: discussed and consent obtained   Timeout:  patient name, date of birth, surgical site, and procedure verified Lesion destroyed using liquid nitrogen: Yes   Region frozen until ice ball extended beyond lesion: Yes   Cryo cycles: 1 or 2. Outcome: patient tolerated procedure well with no complications   Post-procedure details:  wound care instructions given    Lentigines  Multiple benign nevi  Seborrheic keratoses  Actinic elastosis  Cherry angioma   Return in about 1 year (around  09/12/2024) for TBSE, with Dr. Katrinka Blazing, Hx Dysplastic Nevi.  Anise Salvo, RMA, am acting as scribe for Elie Goody, MD .   Documentation: I have reviewed the above documentation for accuracy and completeness, and I agree with the above.  Elie Goody, MD

## 2023-09-13 NOTE — Patient Instructions (Addendum)

## 2023-11-11 ENCOUNTER — Other Ambulatory Visit: Payer: Self-pay

## 2023-11-11 MED ORDER — AMOXICILLIN-POT CLAVULANATE 875-125 MG PO TABS
1.0000 | ORAL_TABLET | Freq: Two times a day (BID) | ORAL | 0 refills | Status: DC
  Filled 2023-11-11: qty 20, 10d supply, fill #0

## 2023-12-01 ENCOUNTER — Other Ambulatory Visit: Payer: Self-pay | Admitting: Nurse Practitioner

## 2023-12-01 ENCOUNTER — Other Ambulatory Visit: Payer: Self-pay

## 2023-12-01 ENCOUNTER — Encounter: Payer: Self-pay | Admitting: Nurse Practitioner

## 2023-12-01 DIAGNOSIS — K219 Gastro-esophageal reflux disease without esophagitis: Secondary | ICD-10-CM

## 2023-12-01 MED ORDER — ESOMEPRAZOLE MAGNESIUM 40 MG PO CPDR
40.0000 mg | DELAYED_RELEASE_CAPSULE | Freq: Every day | ORAL | 3 refills | Status: DC | PRN
Start: 2023-12-01 — End: 2024-09-25
  Filled 2023-12-01: qty 90, 90d supply, fill #0
  Filled 2024-04-08: qty 90, 90d supply, fill #1

## 2023-12-29 ENCOUNTER — Other Ambulatory Visit: Payer: Self-pay

## 2024-02-17 ENCOUNTER — Ambulatory Visit: Payer: Self-pay | Admitting: Podiatry

## 2024-02-17 DIAGNOSIS — M7672 Peroneal tendinitis, left leg: Secondary | ICD-10-CM

## 2024-02-17 NOTE — Progress Notes (Signed)
 Subjective:  Patient ID: Colton Perez, male    DOB: [birth year],  MRN: [id number]  Chief Complaint  Patient presents with   Foot Pain    Foot pain on the lateral side of foot worse in the morning and late evenings     53 y.o. male presents with the above complaint.  Patient presents with left peroneal tendinitis noted with ambulation as her pressure was in the morning and recently started doing well.  Echocardiogram negative.  She denies any other acute injuries.  Pain scale 7 out of 10 dull aching nature.  Discussed treatment options for this.   Review of Systems: Negative except as noted in the HPI. Denies N/V/F/Ch.  Past Medical History:  Diagnosis Date   Blood dyscrasia    Chicken pox    Colon polyp    Cytopenia    PSEUDOCYTOPENIA-    LOW   PLATELETS,  H+H     Dysplastic nevus 08/01/2020   L back left of midline - mod   GERD (gastroesophageal reflux disease)    Seizures (HCC)    Spherocytosis (HCC)     Current Outpatient Medications:    ketorolac (TORADOL) 10 MG tablet, Take by mouth., Disp: , Rfl:    methocarbamol (ROBAXIN) 500 MG tablet, Take by mouth., Disp: , Rfl:    esomeprazole (NEXIUM) 40 MG capsule, Take 1 capsule (40 mg total) by mouth daily as needed., Disp: 90 capsule, Rfl: 3   lamoTRIgine (LAMICTAL XR) 100 MG 24 hour tablet, Take 1 tablet (100 mg total) by mouth daily along with 200 mg XR tab to equal 300 mg daily., Disp: 90 tablet, Rfl: 3   lamoTRIgine (LAMICTAL XR) 50 MG 24 hour tablet, Take 1 tablet (50 mg total) by mouth daily along with 300 mg lamictal to total 350 mg XR daily, Disp: 90 tablet, Rfl: 3   LamoTRIgine 200 MG TB24 24 hour tablet, Take 1 tablet (200 mg total) by mouth daily along with 100 mg tab to equal 300 mg daily, Disp: 90 tablet, Rfl: 3  Social History   Tobacco Use  Smoking Status Every Day   Current packs/day: 1.00   Average packs/day: 1 pack/day for 35.0 years (35.0 ttl pk-yrs)   Types: Cigarettes  Smokeless Tobacco Never     Allergies  Allergen Reactions   Aspirin Swelling    SWELLING REACTION UNSPECIFIED  SWELLING REACTION UNSPECIFIED     Like analgesic, salicylates group   Objective:  There were no vitals filed for this visit. There is no height or weight on file to calculate BMI. Constitutional Well developed. Well nourished.  Vascular Dorsalis pedis pulses palpable bilaterally. Posterior tibial pulses palpable bilaterally. Capillary refill normal to all digits.  No cyanosis or clubbing noted. Pedal hair growth normal.  Neurologic Normal speech. Oriented to person, place, and time. Epicritic sensation to light touch grossly present bilaterally.  Dermatologic Nails well groomed and normal in appearance. No open wounds. No skin lesions.  Orthopedic: Pain on palpation hurts with ambulation hurts with pressure.  Pain along the course of the peroneal tendon pain with resisted dorsiflexion eversion of the foot no pain with plantarflexion inversion of the   Radiographs: None Assessment:   1. Peroneal tendonitis of left lower extremity    Plan:  Patient was evaluated and treated and all questions answered.  Left peroneal tendinitis - All questions and concerns were discussed with the patient in extensive detail - Given the amount of pain that he is having he will benefit  from cam boot immobilization Cam boot was dispensed I encouraged him to wear it at all times when he is on his feet he states understanding - If there is no improvement we will discuss steroid injection during next visit  No follow-ups on file.

## 2024-03-09 ENCOUNTER — Telehealth: Payer: Self-pay | Admitting: Neurosurgery

## 2024-03-09 NOTE — Telephone Encounter (Signed)
-----   Message from [facility] sent at 03/06/2024  7:02 AM EDT ----- Hey-   Can you make an appointment for him wth Ace Holder? Sounds like potential peripheral nerve issue.  Or with one of the Pas  Thanks, [employer]

## 2024-03-16 ENCOUNTER — Ambulatory Visit: Admitting: Podiatry

## 2024-03-21 NOTE — Progress Notes (Unsigned)
 Referring Physician:  No referring provider defined for this encounter.  Primary Physician:  Bluford Burkitt, NP  History of Present Illness: 03/22/2024 [name] is here today with a chief complaint of \\right  anterior lateral thigh pain.  He is wearing a cam boot on his left foot and after taking this off he developed pain numbness and tingling in his right thigh.  Started mostly with numbness and then has since progressed to tingling and pain.  He is not having any motor issues.  Has not had physical therapy.  No injections.  No traumas or recent surgerie in that location.  Review of Systems:  A 10 point review of systems is negative, except for the pertinent positives and negatives detailed in the HPI.  Past Medical History: Past Medical History:  Diagnosis Date   Blood dyscrasia    Chicken pox    Colon polyp    Cytopenia    PSEUDOCYTOPENIA-    LOW   PLATELETS,  H+H     Dysplastic nevus 08/01/2020   L back left of midline - mod   GERD (gastroesophageal reflux disease)    Seizures (HCC)    Spherocytosis (HCC)     Past Surgical History: Past Surgical History:  Procedure Laterality Date   CHOLECYSTECTOMY N/A 07/28/2018   Procedure: LAPAROSCOPIC CHOLECYSTECTOMY WITH INTRAOPERATIVE CHOLANGIOGRAM ERAS PATHWAY;  Surgeon: Dareen Ebbing, MD;  Location: [facility] OR;  Service: General;  Laterality: N/A;   COLONOSCOPY WITH PROPOFOL  N/A 10/10/2021   Procedure: COLONOSCOPY WITH PROPOFOL ;  Surgeon: Shane Darling, MD;  Location: [facility] ENDOSCOPY;  Service: Endoscopy;  Laterality: N/A;  REQUESTS NO STUDENT CRNA   CYST EXCISION PERINEAL N/A 07/28/2018   Procedure: CYST EXCISION PERINEAL;  Surgeon: Dareen Ebbing, MD;  Location: [facility] OR;  Service: General;  Laterality: N/A;   PILONIDAL CYST EXCISION  1996   TESTICULAR EXPLORATION  1978   WISDOM TOOTH EXTRACTION      Allergies: Allergies as of 03/22/2024 - Review Complete 03/22/2024  Allergen Reaction Noted   Aspirin Swelling  09/11/2015    Medications:  Current Outpatient Medications:    esomeprazole  (NEXIUM ) 40 MG capsule, Take 1 capsule (40 mg total) by mouth daily as needed., Disp: 90 capsule, Rfl: 3   lamoTRIgine  (LAMICTAL  XR) 100 MG 24 hour tablet, Take 1 tablet (100 mg total) by mouth daily along with 200 mg XR tab to equal 300 mg daily., Disp: 90 tablet, Rfl: 3   lamoTRIgine  (LAMICTAL  XR) 50 MG 24 hour tablet, Take 1 tablet (50 mg total) by mouth daily along with 300 mg lamictal  to total 350 mg XR daily, Disp: 90 tablet, Rfl: 3   LamoTRIgine  200 MG TB24 24 hour tablet, Take 1 tablet (200 mg total) by mouth daily along with 100 mg tab to equal 300 mg daily, Disp: 90 tablet, Rfl: 3  Social History: Social History   Tobacco Use   Smoking status: Every Day    Current packs/day: 1.00    Average packs/day: 1 pack/day for 35.0 years (35.0 ttl pk-yrs)    Types: Cigarettes   Smokeless tobacco: Never  Vaping Use   Vaping status: Never Used  Substance Use Topics   Alcohol use: Yes    Alcohol/week: 1.0 standard drink of alcohol    Types: 1 Shots of liquor per week    Comment: rare   Drug use: Never    Family Medical History: Family History  Problem Relation Age of Onset   Hypertension Mother    Hyperlipidemia  Mother    Asthma Mother    Depression Mother    Obesity Mother    Cancer Maternal Grandmother        pancreatic   Asthma Maternal Grandmother    Cancer Maternal Grandfather    Obesity Maternal Grandfather    Cancer Paternal Grandmother    COPD Paternal Grandmother    Cancer Paternal Uncle    Diabetes Maternal Uncle    Obesity Maternal Uncle    Obesity Maternal Uncle     Physical Examination: Vitals:   03/22/24 1459  BP: 126/84    General: Patient is in no apparent distress. Attention to examination is appropriate.  Neck:   Supple.  Full range of motion.  Respiratory: Patient is breathing without any difficulty.   NEUROLOGICAL:     Awake, alert, oriented to person, place,  and time.  Speech is clear and fluent.   Cranial Nerves: Pupils equal round and reactive to light.  Facial tone is symmetric. Shoulder shrug is symmetric. Tongue protrusion is midline.  There is no pronator drift.  Motor Exam:  Full strength with no evidence of deficit or wasting in his right thigh  Reflexes are intact, no evidence of a dropped patella reflex  Allodynia noted in the LF CN distribution on the right       Medical Decision Making  Imaging: None  Electrodiagnostics: None  I have personally reviewed the images and electrodiagnostics and agree with the above interpretation.  Assessment and Plan: Colton Perez is a pleasant 53 y.o. male with history of right-sided anterior lateral thigh pain.  This began with numbness and then progressed to tingling and dysesthesias after removal of a cam boot that he was wearing on the left side.  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  His physical exam is reassuring with sensation loss in the LF CN distribution.  At this point he states that it only mildly affects him, we talked about different position changes he could do to try to help ease some of the symptoms when they arise, his seem to be mostly exacerbated by straight leg or hip extension, we discussed possibility of keeping his leg elevated on a step when it was becoming symptomatic.  At this time he would not like any injections as is not bothering him enough, we will plan on following him for approximately 3 months after its presentation to see whether or not we can see spontaneous improvement.  If not we will plan for injections..   Thank you for involving me in the care of this patient.    Carroll Clamp MD/MSCR Neurosurgery - Peripheral Nerve Surgery

## 2024-03-22 ENCOUNTER — Ambulatory Visit: Admitting: Neurosurgery

## 2024-03-22 ENCOUNTER — Encounter: Payer: Self-pay | Admitting: Neurosurgery

## 2024-03-22 VITALS — BP 126/84 | Ht 67.5 in | Wt 240.0 lb

## 2024-03-22 DIAGNOSIS — G5711 Meralgia paresthetica, right lower limb: Secondary | ICD-10-CM | POA: Diagnosis not present

## 2024-04-10 ENCOUNTER — Other Ambulatory Visit: Payer: Self-pay

## 2024-04-26 ENCOUNTER — Ambulatory Visit: Admitting: Nurse Practitioner

## 2024-04-26 ENCOUNTER — Encounter: Payer: Self-pay | Admitting: Nurse Practitioner

## 2024-04-26 VITALS — BP 120/62 | HR 64 | Temp 98.2°F | Ht 67.5 in | Wt 245.4 lb

## 2024-04-26 DIAGNOSIS — Z125 Encounter for screening for malignant neoplasm of prostate: Secondary | ICD-10-CM | POA: Diagnosis not present

## 2024-04-26 DIAGNOSIS — Z13220 Encounter for screening for lipoid disorders: Secondary | ICD-10-CM | POA: Diagnosis not present

## 2024-04-26 DIAGNOSIS — Z1329 Encounter for screening for other suspected endocrine disorder: Secondary | ICD-10-CM

## 2024-04-26 DIAGNOSIS — F17210 Nicotine dependence, cigarettes, uncomplicated: Secondary | ICD-10-CM

## 2024-04-26 DIAGNOSIS — Z0001 Encounter for general adult medical examination with abnormal findings: Secondary | ICD-10-CM

## 2024-04-26 DIAGNOSIS — E669 Obesity, unspecified: Secondary | ICD-10-CM

## 2024-04-26 DIAGNOSIS — Z23 Encounter for immunization: Secondary | ICD-10-CM | POA: Diagnosis not present

## 2024-04-26 DIAGNOSIS — R1310 Dysphagia, unspecified: Secondary | ICD-10-CM | POA: Diagnosis not present

## 2024-04-26 DIAGNOSIS — R197 Diarrhea, unspecified: Secondary | ICD-10-CM | POA: Diagnosis not present

## 2024-04-26 DIAGNOSIS — D580 Hereditary spherocytosis: Secondary | ICD-10-CM | POA: Diagnosis not present

## 2024-04-26 LAB — CBC WITH DIFFERENTIAL/PLATELET
Basophils Absolute: 0 10*3/uL (ref 0.0–0.1)
Basophils Relative: 0.2 % (ref 0.0–3.0)
Eosinophils Absolute: 0.2 10*3/uL (ref 0.0–0.7)
Eosinophils Relative: 2.1 % (ref 0.0–5.0)
HCT: 30.1 % — ABNORMAL LOW (ref 39.0–52.0)
Hemoglobin: 10.8 g/dL — ABNORMAL LOW (ref 13.0–17.0)
Lymphocytes Relative: 23 % (ref 12.0–46.0)
Lymphs Abs: 1.8 10*3/uL (ref 0.7–4.0)
MCHC: 35.8 g/dL (ref 30.0–36.0)
MCV: 90.6 fl (ref 78.0–100.0)
Monocytes Absolute: 0.5 10*3/uL (ref 0.1–1.0)
Monocytes Relative: 6.9 % (ref 3.0–12.0)
Neutro Abs: 5.3 10*3/uL (ref 1.4–7.7)
Neutrophils Relative %: 67.8 % (ref 43.0–77.0)
Platelets: 124 10*3/uL — ABNORMAL LOW (ref 150.0–400.0)
RBC: 3.32 Mil/uL — ABNORMAL LOW (ref 4.22–5.81)
RDW: 20.2 % — ABNORMAL HIGH (ref 11.5–15.5)
WBC: 7.9 10*3/uL (ref 4.0–10.5)

## 2024-04-26 LAB — IBC + FERRITIN
Ferritin: 237.3 ng/mL (ref 22.0–322.0)
Iron: 136 ug/dL (ref 42–165)
Saturation Ratios: 49.1 % (ref 20.0–50.0)
TIBC: 277.2 ug/dL (ref 250.0–450.0)
Transferrin: 198 mg/dL — ABNORMAL LOW (ref 212.0–360.0)

## 2024-04-26 LAB — LIPID PANEL
Cholesterol: 114 mg/dL (ref 0–200)
HDL: 30.3 mg/dL — ABNORMAL LOW (ref >39.00)
LDL Cholesterol: 66 mg/dL (ref 0–99)
NonHDL: 83.46
Total CHOL/HDL Ratio: 4
Triglycerides: 87 mg/dL (ref 0.0–149.0)
VLDL: 17.4 mg/dL (ref 0.0–40.0)

## 2024-04-26 LAB — BASIC METABOLIC PANEL WITH GFR
BUN: 15 mg/dL (ref 6–23)
CO2: 31 meq/L (ref 19–32)
Calcium: 9.2 mg/dL (ref 8.4–10.5)
Chloride: 105 meq/L (ref 96–112)
Creatinine, Ser: 1 mg/dL (ref 0.40–1.50)
GFR: 87.12 mL/min (ref >60.00)
Glucose, Bld: 117 mg/dL — ABNORMAL HIGH (ref 70–99)
Potassium: 4.1 meq/L (ref 3.5–5.1)
Sodium: 140 meq/L (ref 135–145)

## 2024-04-26 LAB — HEPATIC FUNCTION PANEL
ALT: 16 U/L (ref 0–53)
AST: 14 U/L (ref 0–37)
Albumin: 4.2 g/dL (ref 3.5–5.2)
Alkaline Phosphatase: 57 U/L (ref 39–117)
Bilirubin, Direct: 0.4 mg/dL — ABNORMAL HIGH (ref 0.0–0.3)
Total Bilirubin: 1.9 mg/dL — ABNORMAL HIGH (ref 0.2–1.2)
Total Protein: 6.4 g/dL (ref 6.0–8.3)

## 2024-04-26 LAB — PSA: PSA: 0.09 ng/mL — ABNORMAL LOW (ref 0.10–4.00)

## 2024-04-26 LAB — TSH: TSH: 1.39 u[IU]/mL (ref 0.35–5.50)

## 2024-04-26 NOTE — Progress Notes (Unsigned)
 Bluford Burkitt, NP-C Phone: [phone]  Colton Perez is a 53 y.o. male who presents today for annual exam.   ***  Social History   Tobacco Use  Smoking Status Every Day   Current packs/day: 1.00   Average packs/day: 1 pack/day for 35.0 years (35.0 ttl pk-yrs)   Types: Cigarettes  Smokeless Tobacco Never    Current Outpatient Medications on File Prior to Visit  Medication Sig Dispense Refill   esomeprazole  (NEXIUM ) 40 MG capsule Take 1 capsule (40 mg total) by mouth daily as needed. 90 capsule 3   lamoTRIgine  (LAMICTAL  XR) 100 MG 24 hour tablet Take 1 tablet (100 mg total) by mouth daily along with 200 mg XR tab to equal 300 mg daily. 90 tablet 3   lamoTRIgine  (LAMICTAL  XR) 50 MG 24 hour tablet Take 1 tablet (50 mg total) by mouth daily along with 300 mg lamictal  to total 350 mg XR daily 90 tablet 3   LamoTRIgine  200 MG TB24 24 hour tablet Take 1 tablet (200 mg total) by mouth daily along with 100 mg tab to equal 300 mg daily 90 tablet 3   No current facility-administered medications on file prior to visit.     ROS see history of present illness  Objective  Physical Exam Vitals:   04/26/24 1110  BP: 120/62  Pulse: 64  Temp: 98.2 F (36.8 C)  SpO2: 94%    BP Readings from Last 3 Encounters:  04/26/24 120/62  03/22/24 126/84  07/02/23 110/60   Wt Readings from Last 3 Encounters:  04/26/24 245 lb 6.4 oz (111.3 kg)  03/22/24 240 lb (108.9 kg)  07/02/23 240 lb 3.2 oz (109 kg)    Physical Exam Constitutional:      General: He is not in acute distress.    Appearance: Normal appearance.  HENT:     Head: Normocephalic.     Right Ear: Tympanic membrane normal.     Left Ear: Tympanic membrane normal.     Nose: Nose normal.     Mouth/Throat:     Mouth: Mucous membranes are moist.     Pharynx: Oropharynx is clear.   Eyes:     Conjunctiva/sclera: Conjunctivae normal.     Pupils: Pupils are equal, round, and reactive to light.   Neck:     Thyroid : No  thyromegaly.   Cardiovascular:     Rate and Rhythm: Normal rate and regular rhythm.     Heart sounds: Normal heart sounds.  Pulmonary:     Effort: Pulmonary effort is normal.     Breath sounds: Normal breath sounds.  Abdominal:     General: Abdomen is flat. Bowel sounds are normal.     Palpations: Abdomen is soft. There is no mass.     Tenderness: There is no abdominal tenderness.   Musculoskeletal:        General: Normal range of motion.  Lymphadenopathy:     Cervical: No cervical adenopathy.   Skin:    General: Skin is warm and dry.     Findings: No rash.   Neurological:     General: No focal deficit present.     Mental Status: He is alert.   Psychiatric:        Mood and Affect: Mood normal.        Behavior: Behavior normal.      Assessment/Plan: Please see individual problem list.  Encounter for routine adult health examination with abnormal findings  Dysphagia, unspecified type -  Ambulatory referral to Gastroenterology  Spherocytosis [facility])  Obesity (BMI 30-39.9)  Cigarette nicotine dependence without complication -     Ambulatory Referral for Lung Cancer Scre  Lipid screening  Thyroid  disorder screen  Screening PSA (prostate specific antigen)  Need for shingles vaccine -     Varicella-zoster vaccine IM     Health Maintenance: ***  Return in about 1 year (around 04/26/2025) for Annual Exam, sooner as needed.   Bluford Burkitt, NP-C [facility] - [facility]

## 2024-04-27 ENCOUNTER — Other Ambulatory Visit

## 2024-04-27 DIAGNOSIS — R7309 Other abnormal glucose: Secondary | ICD-10-CM

## 2024-04-28 LAB — HEMOGLOBIN A1C
Est. average glucose Bld gHb Est-mCnc: 77 mg/dL
Hgb A1c MFr Bld: 4.3 % — ABNORMAL LOW (ref 4.8–5.6)

## 2024-05-01 ENCOUNTER — Ambulatory Visit: Payer: Self-pay | Admitting: Nurse Practitioner

## 2024-05-03 ENCOUNTER — Encounter: Payer: Self-pay | Admitting: Nurse Practitioner

## 2024-05-03 DIAGNOSIS — R197 Diarrhea, unspecified: Secondary | ICD-10-CM | POA: Insufficient documentation

## 2024-05-03 NOTE — Assessment & Plan Note (Addendum)
 Recurrent diarrhea is possibly bacterial, with a previous Yersinia infection treated and no evidence of C. difficile. Symptoms improved after amoxicillin . Monitor symptoms and contact the provider if recurs. Consider repeat stool testing if symptoms return.

## 2024-05-03 NOTE — Assessment & Plan Note (Signed)
Managed by Hematology.

## 2024-05-03 NOTE — Assessment & Plan Note (Addendum)
 Physical exam complete. We will order routine lab work as outlined and contact patient with results. He is up to date on colonoscopy and tetanus vaccine. Prostate screening today in lab work. Declines flu vaccine. He has received 3 COVID vaccines and declines additional. Eligible for shingles vaccine and lung cancer screening. Smokes one pack per day for 35 years with a noted chronic cough. Counseling provided on tobacco cessation. Administer the first dose of the shingles vaccine today and schedule the second dose in 2-6 months. Order lung cancer screening with a low-dose CT scan. Advise on dietary improvements and exercise. Continue routine dental and eye exams. Return to care in one year, sooner as needed.

## 2024-05-03 NOTE — Assessment & Plan Note (Addendum)
 Chest tightness with dense foods suggests an esophageal stricture. He prefers dilation over medication change. Refer to GI for EGD.

## 2024-05-28 ENCOUNTER — Encounter: Payer: Self-pay | Admitting: Nurse Practitioner

## 2024-06-12 ENCOUNTER — Telehealth: Payer: Self-pay | Admitting: Acute Care

## 2024-06-12 DIAGNOSIS — Z87891 Personal history of nicotine dependence: Secondary | ICD-10-CM

## 2024-06-12 DIAGNOSIS — Z122 Encounter for screening for malignant neoplasm of respiratory organs: Secondary | ICD-10-CM

## 2024-06-12 DIAGNOSIS — F17210 Nicotine dependence, cigarettes, uncomplicated: Secondary | ICD-10-CM

## 2024-06-12 NOTE — Telephone Encounter (Signed)
 Lung Cancer Screening Narrative/Criteria Questionnaire (Cigarette Smokers Only- No Cigars/Pipes/vapes)   Colton Perez   SDMV:06/20/24 at 330p/Kristen                                           1970/12/31               LDCT: 06/21/24 at 0900am/[facility]    53 y.o.   Phone: [phone]  Lung Screening Narrative (confirm age 59-77 yrs Medicare / 50-80 yrs Private pay insurance)   Insurance information:[facility] Aetna   Referring Provider:Lester   This screening involves an initial phone call with a team member from our program. It is called a shared decision making visit. The initial meeting is required by insurance and Medicare to make sure you understand the program. This appointment takes about 15-20 minutes to complete. The CT scan will completed at a separate date/time. This scan takes about 5-10 minutes to complete and you may eat and drink before and after the scan.  Criteria questions for Lung Cancer Screening:   Are you a current or former smoker? Current Age began smoking: 16y   If you are a former smoker, what year did you quit smoking? NA   To calculate your smoking history, I need an accurate estimate of how many packs of cigarettes you smoked per day and for how many years. (Not just the number of PPD you are now smoking)   Years smoking 36 x Packs per day 1 = Pack years 36   (at least 20 pack yrs)   (Make sure they understand that we need to know how much they have smoked in the past, not just the number of PPD they are smoking now)  Do you have a personal history of cancer?  No    Do you have a family history of cancer? Yes  (cancer type and and relative) mat GMA/lung and Mat GPA/pancreatic  Are you coughing up blood?  No  Have you had unexplained weight loss of 15 lbs or more in the last 6 months? No  It looks like you meet all criteria.     Additional information: N/A

## 2024-06-20 ENCOUNTER — Ambulatory Visit: Admitting: *Deleted

## 2024-06-20 ENCOUNTER — Encounter: Payer: Self-pay | Admitting: *Deleted

## 2024-06-20 DIAGNOSIS — F17210 Nicotine dependence, cigarettes, uncomplicated: Secondary | ICD-10-CM

## 2024-06-20 NOTE — Progress Notes (Signed)
 Virtual Visit via Video Note  I connected with Brighton Pilley Poma on 06/20/24 at  3:30 PM EDT by a video enabled telemedicine application and verified that I am speaking with the correct person using two identifiers.  Location: Patient: in home Provider: [address] [place], [place], [address]     Shared Decision Making Visit Lung Cancer Screening Program [phone])   Eligibility: Age 53 y.o. Pack Years Smoking History Calculation 36 (# packs/per year x # years smoked) Recent History of coughing up blood  no Unexplained weight loss? no ( >Than 15 pounds within the last 6 months ) Prior History Lung / other cancer no (Diagnosis within the last 5 years already requiring surveillance chest CT Scans). Smoking Status Current Smoker Former Smokers: Years since quit:  NA  Quit Date: NA  Visit Components: Discussion included one or more decision making aids. yes Discussion included risk/benefits of screening. yes Discussion included potential follow up diagnostic testing for abnormal scans. yes Discussion included meaning and risk of over diagnosis. yes Discussion included meaning and risk of False Positives. yes Discussion included meaning of total radiation exposure. yes  Counseling Included: Importance of adherence to annual lung cancer LDCT screening. yes Impact of comorbidities on ability to participate in the program. yes Ability and willingness to under diagnostic treatment. yes  Smoking Cessation Counseling: Current Smokers:  Discussed importance of smoking cessation. yes Information about tobacco cessation classes and interventions provided to patient. yes Patient provided with ticket for LDCT Scan. yes Symptomatic Patient. no  Counseling NA Diagnosis Code: Tobacco Use Z72.0 Asymptomatic Patient yes  Counseling (Intermediate counseling: > three minutes counseling) [id number] Counseled patient 3-4 minutes regarding tobacco use.   Former Smokers:  Discussed the  importance of maintaining cigarette abstinence. yes Diagnosis Code: Personal History of Nicotine Dependence. S12.108 Information about tobacco cessation classes and interventions provided to patient. Yes Patient provided with ticket for LDCT Scan. yes Written Order for Lung Cancer Screening with LDCT placed in Epic. Yes (CT Chest Lung Cancer Screening Low Dose W/O CM) [id number] Z12.2-Screening of respiratory organs Z87.891-Personal history of nicotine dependence   Josette Ranger, RN 06/20/24

## 2024-06-20 NOTE — Patient Instructions (Signed)

## 2024-06-21 ENCOUNTER — Ambulatory Visit
Admission: RE | Admit: 2024-06-21 | Discharge: 2024-06-21 | Disposition: A | Discharge status: Home / Self Care | Source: Ambulatory Visit | Attending: Acute Care | Admitting: Acute Care

## 2024-06-21 DIAGNOSIS — F17210 Nicotine dependence, cigarettes, uncomplicated: Secondary | ICD-10-CM | POA: Diagnosis not present

## 2024-06-21 DIAGNOSIS — Z87891 Personal history of nicotine dependence: Secondary | ICD-10-CM | POA: Insufficient documentation

## 2024-06-21 DIAGNOSIS — Z122 Encounter for screening for malignant neoplasm of respiratory organs: Secondary | ICD-10-CM | POA: Insufficient documentation

## 2024-06-21 NOTE — Progress Notes (Addendum)
 Counseled patient 4 minutes regarding tobacco use.

## 2024-07-03 ENCOUNTER — Encounter: Payer: Self-pay | Admitting: Nurse Practitioner

## 2024-07-07 ENCOUNTER — Other Ambulatory Visit: Payer: Self-pay | Admitting: Acute Care

## 2024-07-07 DIAGNOSIS — Z87891 Personal history of nicotine dependence: Secondary | ICD-10-CM

## 2024-07-07 DIAGNOSIS — F17210 Nicotine dependence, cigarettes, uncomplicated: Secondary | ICD-10-CM

## 2024-07-07 DIAGNOSIS — Z122 Encounter for screening for malignant neoplasm of respiratory organs: Secondary | ICD-10-CM

## 2024-07-19 ENCOUNTER — Ambulatory Visit (INDEPENDENT_AMBULATORY_CARE_PROVIDER_SITE_OTHER)

## 2024-07-19 DIAGNOSIS — Z23 Encounter for immunization: Secondary | ICD-10-CM | POA: Diagnosis not present

## 2024-07-19 NOTE — Progress Notes (Signed)
 Patient was administered a Shingles vaccine into his left deltoid. Patient tolerated the shingles vaccine well.

## 2024-07-23 ENCOUNTER — Encounter: Payer: Self-pay | Admitting: Nurse Practitioner

## 2024-07-24 NOTE — Telephone Encounter (Signed)
 Vm also left on wifes who is on DPR phone to let us  know if the appt is okay to schedule

## 2024-07-24 NOTE — Telephone Encounter (Signed)
 Detailed vm left to CB to let us  know if he wants the appt

## 2024-07-28 ENCOUNTER — Encounter: Payer: Self-pay | Admitting: Nurse Practitioner

## 2024-07-28 ENCOUNTER — Ambulatory Visit: Admitting: Nurse Practitioner

## 2024-07-28 ENCOUNTER — Telehealth: Payer: Self-pay

## 2024-07-28 ENCOUNTER — Other Ambulatory Visit: Payer: Self-pay

## 2024-07-28 VITALS — BP 114/68 | HR 69 | Temp 98.0°F | Ht 67.5 in | Wt 245.8 lb

## 2024-07-28 DIAGNOSIS — R1310 Dysphagia, unspecified: Secondary | ICD-10-CM | POA: Diagnosis not present

## 2024-07-28 DIAGNOSIS — K219 Gastro-esophageal reflux disease without esophagitis: Secondary | ICD-10-CM

## 2024-07-28 MED ORDER — FAMOTIDINE 20 MG PO TABS
20.0000 mg | ORAL_TABLET | Freq: Two times a day (BID) | ORAL | 1 refills | Status: DC
  Filled 2024-07-28: qty 30, 15d supply, fill #0

## 2024-07-28 NOTE — Progress Notes (Signed)
 Established Patient Office Visit  Subjective:  Patient ID: Colton Perez, male    DOB: Nov 22, 1970  Age: 53 y.o. MRN: [id number]  CC:  Chief Complaint  Patient presents with   Acute Visit    Trouble swallowing Pressure in throat Hoarse voice Pressure above both eyes Coughing but mucous is only sometimes x 2 weeks   Discussed the use of a AI scribe software for clinical note transcription with the patient, who gave verbal consent to proceed.  HPI  Colton Perez is a 53 year old male who presents with throat pressure and difficulty swallowing.  He experiences throat pressure and hoarseness for the past two to three weeks, with pressure intensifying after eating dense foods like meat and bread. Ibuprofen  and Tylenol  provide partial relief. No sore throat is present. He has a 35-year smoking history.  He reports sinus-related symptoms, including pressure above both eyes, occurring twice in the past three to four weeks, with each episode lasting about four days. No cough, congestion, or blood in mucus, though mucus is present about half the time.  HPI   Past Medical History:  Diagnosis Date   Allergy    Anemia    Blood dyscrasia    Chicken pox    Colon polyp    Cytopenia    PSEUDOCYTOPENIA-    LOW   PLATELETS,  H+H     Dysplastic nevus 08/01/2020   L back left of midline - mod   GERD (gastroesophageal reflux disease)    Seizures (HCC)    Spherocytosis     Past Surgical History:  Procedure Laterality Date   CHOLECYSTECTOMY N/A 07/28/2018   Procedure: LAPAROSCOPIC CHOLECYSTECTOMY WITH INTRAOPERATIVE CHOLANGIOGRAM ERAS PATHWAY;  Surgeon: Belinda Cough, MD;  Location: [facility] OR;  Service: General;  Laterality: N/A;   COLONOSCOPY WITH PROPOFOL  N/A 10/10/2021   Procedure: COLONOSCOPY WITH PROPOFOL ;  Surgeon: Maryruth Ole DASEN, MD;  Location: [facility] ENDOSCOPY;  Service: Endoscopy;  Laterality: N/A;  REQUESTS NO STUDENT CRNA   CYST EXCISION PERINEAL N/A 07/28/2018    Procedure: CYST EXCISION PERINEAL;  Surgeon: Belinda Cough, MD;  Location: [facility] OR;  Service: General;  Laterality: N/A;   ESOPHAGOGASTRODUODENOSCOPY N/A 08/02/2024   Procedure: EGD (ESOPHAGOGASTRODUODENOSCOPY);  Surgeon: Toledo, Ladell POUR, MD;  Location: [facility] ENDOSCOPY;  Service: Gastroenterology;  Laterality: N/A;  NEEDS TO GET CHILD ON SCHOOL BUS   PILONIDAL CYST EXCISION  1996   TESTICULAR EXPLORATION  1978   WISDOM TOOTH EXTRACTION      Family History  Problem Relation Age of Onset   Hypertension Mother    Hyperlipidemia Mother    Asthma Mother    Depression Mother    Obesity Mother    Cancer Maternal Grandmother        pancreatic   Asthma Maternal Grandmother    Cancer Maternal Grandfather    Obesity Maternal Grandfather    Cancer Paternal Grandmother    COPD Paternal Grandmother    Cancer Paternal Uncle    Diabetes Maternal Uncle    Obesity Maternal Uncle    Obesity Maternal Uncle     Social History   Socioeconomic History   Marital status: Married    Spouse name: Not on file   Number of children: Not on file   Years of education: Not on file   Highest education level: Associate degree: academic program  Occupational History   Not on file  Tobacco Use   Smoking status: Every Day    Current packs/day: 1.00  Average packs/day: 1 pack/day for 35.0 years (35.0 ttl pk-yrs)    Types: Cigarettes   Smokeless tobacco: Never  Vaping Use   Vaping status: Never Used  Substance and Sexual Activity   Alcohol use: Yes    Alcohol/week: 1.0 standard drink of alcohol    Types: 1 Shots of liquor per week    Comment: rare   Drug use: Never   Sexual activity: Yes    Birth control/protection: None  Other Topics Concern   Not on file  Social History Narrative   Not on file   Social Drivers of Health   Financial Resource Strain: Low Risk  (07/31/2024)   Received from [facility]   Overall Financial Resource Strain (CARDIA)    Difficulty of Paying Living  Expenses: Not hard at all  Food Insecurity: No Food Insecurity (07/31/2024)   Received from [facility]   Hunger Vital Sign    Within the past 12 months, you worried that your food would run out before you got the money to buy more.: Never true    Within the past 12 months, the food you bought just didn't last and you didn't have money to get more.: Never true  Transportation Needs: No Transportation Needs (07/31/2024)   Received from [facility] - Transportation    In the past 12 months, has lack of transportation kept you from medical appointments or from getting medications?: No    Lack of Transportation (Non-Medical): No  Physical Activity: Inactive (04/25/2024)   Exercise Vital Sign    Days of Exercise per Week: 0 days    Minutes of Exercise per Session: Not on file  Stress: No Stress Concern Present (04/25/2024)   [employer] of Occupational Health - Occupational Stress Questionnaire    Feeling of Stress: Not at all  Social Connections: Socially Integrated (04/25/2024)   Social Connection and Isolation Panel    Frequency of Communication with Friends and Family: More than three times a week    Frequency of Social Gatherings with Friends and Family: Once a week    Attends Religious Services: More than 4 times per year    Active Member of [employer] or Organizations: Yes    Attends Engineer, structural: More than 4 times per year    Marital Status: Married  Catering manager Violence: Not on file     Outpatient Medications Prior to Visit  Medication Sig Dispense Refill   esomeprazole  (NEXIUM ) 40 MG capsule Take 1 capsule (40 mg total) by mouth daily as needed. 90 capsule 3   lamoTRIgine  (LAMICTAL  XR) 100 MG 24 hour tablet Take 1 tablet (100 mg total) by mouth daily along with 200 mg XR tab to equal 300 mg daily. 90 tablet 3   lamoTRIgine  (LAMICTAL  XR) 50 MG 24 hour tablet Take 1 tablet (50 mg total) by mouth daily along with 300  mg lamictal  to total 350 mg XR daily 90 tablet 3   LamoTRIgine  200 MG TB24 24 hour tablet Take 1 tablet (200 mg total) by mouth daily along with 100 mg tab to equal 300 mg daily 90 tablet 3   No facility-administered medications prior to visit.    Allergies  Allergen Reactions   Aspirin Swelling    SWELLING REACTION UNSPECIFIED  SWELLING REACTION UNSPECIFIED     Like analgesic, salicylates group    ROS Review of Systems Negative unless indicated in HPI.    Objective:  Physical Exam Constitutional:      Appearance: Normal appearance.  HENT:     Right Ear: Tympanic membrane normal.     Left Ear: Tympanic membrane normal.     Nose: No congestion.     Mouth/Throat:     Mouth: Mucous membranes are moist.  Eyes:     Conjunctiva/sclera: Conjunctivae normal.     Pupils: Pupils are equal, round, and reactive to light.  Cardiovascular:     Rate and Rhythm: Normal rate and regular rhythm.     Pulses: Normal pulses.     Heart sounds: Normal heart sounds.  Pulmonary:     Effort: Pulmonary effort is normal.     Breath sounds: Normal breath sounds.  Musculoskeletal:     Cervical back: Normal range of motion. No tenderness.  Skin:    General: Skin is warm.     Findings: No bruising.  Neurological:     General: No focal deficit present.     Mental Status: He is alert and oriented to person, place, and time. Mental status is at baseline.  Psychiatric:        Mood and Affect: Mood normal.        Behavior: Behavior normal.        Thought Content: Thought content normal.        Judgment: Judgment normal.     BP 114/68   Pulse 69   Temp 98 F (36.7 C)   Ht 5' 7.5 (1.715 m)   Wt 245 lb 12.8 oz (111.5 kg)   SpO2 99%   BMI 37.93 kg/m  Wt Readings from Last 3 Encounters:  08/02/24 239 lb 9.6 oz (108.7 kg)  07/28/24 245 lb 12.8 oz (111.5 kg)  04/26/24 245 lb 6.4 oz (111.3 kg)     Health Maintenance  Topic Date Due   Pneumococcal Vaccine: 50+ Years (1 of 2 - PCV)  Never done   Hepatitis B Vaccines 19-59 Average Risk (1 of 3 - 19+ 3-dose series) Never done   COVID-19 Vaccine (4 - 2025-26 season) 08/11/2024 (Originally 07/10/2024)   Influenza Vaccine  02/06/2025 (Originally 06/09/2024)   Lung Cancer Screening  06/21/2025   DTaP/Tdap/Td (3 - Td or Tdap) 04/01/2030   Colonoscopy  10/11/2031   Hepatitis C Screening  Completed   HIV Screening  Completed   Zoster Vaccines- Shingrix   Completed   HPV VACCINES  Aged Out   Meningococcal B Vaccine  Aged Out       Topic Date Due   Hepatitis B Vaccines 19-59 Average Risk (1 of 3 - 19+ 3-dose series) Never done    Lab Results  Component Value Date   TSH 1.39 04/26/2024   Lab Results  Component Value Date   WBC 7.9 04/26/2024   HGB 10.8 (L) 04/26/2024   HCT 30.1 (L) 04/26/2024   MCV 90.6 04/26/2024   PLT 124.0 (L) 04/26/2024   Lab Results  Component Value Date   NA 140 04/26/2024   K 4.1 04/26/2024   CO2 31 04/26/2024   GLUCOSE 117 (H) 04/26/2024   BUN 15 04/26/2024   CREATININE 1.00 04/26/2024   BILITOT 1.9 (H) 04/26/2024   ALKPHOS 57 04/26/2024   AST 14 04/26/2024   ALT 16 04/26/2024   PROT 6.4 04/26/2024   ALBUMIN 4.2 04/26/2024   CALCIUM 9.2 04/26/2024   ANIONGAP 9 07/20/2018   GFR 87.12 04/26/2024   Lab Results  Component Value Date   CHOL 114 04/26/2024   Lab Results  Component Value Date   HDL 30.30 (L) 04/26/2024   Lab Results  Component Value Date   LDLCALC 66 04/26/2024   Lab Results  Component Value Date   TRIG 87.0 04/26/2024   Lab Results  Component Value Date   CHOLHDL 4 04/26/2024   Lab Results  Component Value Date   HGBA1C 4.3 (L) 04/27/2024      Assessment & Plan:  Dysphagia, unspecified type Assessment & Plan: Dysphagia with throat pressure and hoarseness for 2-3 weeks, worsening. Previous CT chest unremarkable. ENT noted irritation and redness.  - Order swallow study at [facility]. - Refer to ENT for further evaluation. - Will try to expedite  appointment with GI at [facility]. - Advise ED visit if symptoms worsen, especially with breathing or swallowing difficulty.  Orders: -     Ambulatory referral to ENT -     SLP modified barium swallow; Future  Gastroesophageal reflux disease, unspecified whether esophagitis present Assessment & Plan: GERD with throat pressure post-eating, managed with Nexium . Possible reflux contribution to symptoms. - Prescribe famotidine  for additional acid suppression. - Advise to continue Nexium  40 mg once daily.   Other orders -     Famotidine ; Take 1 tablet (20 mg total) by mouth 2 (two) times daily.  Dispense: 30 tablet; Refill: 1    Follow-up: Return if symptoms worsen or fail to improve.   Sneijder Bernards, NP

## 2024-07-28 NOTE — Patient Instructions (Addendum)
 DYSPHAGIA WITH THROAT PRESSURE AND INTERMITTENT HOARSENESS: You have been experiencing difficulty swallowing, throat pressure, and hoarseness for the past two to three weeks. Previous tests have not shown any serious issues, but further evaluation is needed. -We will order a swallow study at [facility]. -You will be referred to an ENT specialist for further evaluation. -We will expedite your EGD appointment with the GI specialist at [facility] -Continue taking Nexium  (omeprazole) 40 mg once daily. -We will add famotidine  for additional acid suppression.-If your symptoms worsen, especially if you have trouble breathing or swallowing, please go to the emergency department immediately.

## 2024-07-28 NOTE — Telephone Encounter (Signed)
 [facility] GI and had the Patient's appointment set for Monday 07/31/24 at 2:00.  Called Patient and he is aware of the 07/31/24 appointment.

## 2024-07-31 DIAGNOSIS — F17200 Nicotine dependence, unspecified, uncomplicated: Secondary | ICD-10-CM | POA: Diagnosis not present

## 2024-07-31 DIAGNOSIS — E66812 Obesity, class 2: Secondary | ICD-10-CM | POA: Diagnosis not present

## 2024-07-31 DIAGNOSIS — E6609 Other obesity due to excess calories: Secondary | ICD-10-CM | POA: Diagnosis not present

## 2024-07-31 DIAGNOSIS — R1319 Other dysphagia: Secondary | ICD-10-CM | POA: Diagnosis not present

## 2024-07-31 DIAGNOSIS — Z6838 Body mass index (BMI) 38.0-38.9, adult: Secondary | ICD-10-CM | POA: Diagnosis not present

## 2024-07-31 DIAGNOSIS — R569 Unspecified convulsions: Secondary | ICD-10-CM | POA: Diagnosis not present

## 2024-07-31 DIAGNOSIS — K219 Gastro-esophageal reflux disease without esophagitis: Secondary | ICD-10-CM | POA: Diagnosis not present

## 2024-08-02 ENCOUNTER — Ambulatory Visit: Admitting: Anesthesiology

## 2024-08-02 ENCOUNTER — Encounter
Admission: RE | Disposition: A | Discharge status: Home / Self Care | Payer: Self-pay | Source: Home / Self Care | Attending: Internal Medicine

## 2024-08-02 ENCOUNTER — Ambulatory Visit
Admission: RE | Admit: 2024-08-02 | Discharge: 2024-08-02 | Disposition: A | Discharge status: Home / Self Care | Attending: Internal Medicine | Admitting: Internal Medicine

## 2024-08-02 ENCOUNTER — Encounter: Payer: Self-pay | Admitting: Internal Medicine

## 2024-08-02 DIAGNOSIS — K219 Gastro-esophageal reflux disease without esophagitis: Secondary | ICD-10-CM | POA: Insufficient documentation

## 2024-08-02 DIAGNOSIS — Z87891 Personal history of nicotine dependence: Secondary | ICD-10-CM | POA: Diagnosis not present

## 2024-08-02 DIAGNOSIS — D649 Anemia, unspecified: Secondary | ICD-10-CM | POA: Diagnosis not present

## 2024-08-02 DIAGNOSIS — R12 Heartburn: Secondary | ICD-10-CM | POA: Diagnosis not present

## 2024-08-02 DIAGNOSIS — R1310 Dysphagia, unspecified: Secondary | ICD-10-CM | POA: Insufficient documentation

## 2024-08-02 DIAGNOSIS — Z860100 Personal history of colon polyps, unspecified: Secondary | ICD-10-CM | POA: Insufficient documentation

## 2024-08-02 DIAGNOSIS — R1319 Other dysphagia: Secondary | ICD-10-CM | POA: Diagnosis not present

## 2024-08-02 DIAGNOSIS — R490 Dysphonia: Secondary | ICD-10-CM | POA: Diagnosis not present

## 2024-08-02 DIAGNOSIS — R569 Unspecified convulsions: Secondary | ICD-10-CM | POA: Diagnosis not present

## 2024-08-02 HISTORY — PX: ESOPHAGOGASTRODUODENOSCOPY: SHX5428

## 2024-08-02 SURGERY — EGD (ESOPHAGOGASTRODUODENOSCOPY)
Anesthesia: General

## 2024-08-02 MED ORDER — PROPOFOL 500 MG/50ML IV EMUL
INTRAVENOUS | Status: DC | PRN
  Administered 2024-08-02 (×3): 50 mg via INTRAVENOUS
  Administered 2024-08-02: 20 mg via INTRAVENOUS

## 2024-08-02 MED ORDER — SODIUM CHLORIDE 0.9 % IV SOLN
INTRAVENOUS | Status: DC
  Administered 2024-08-02: 20 mL/h via INTRAVENOUS

## 2024-08-02 MED ORDER — LIDOCAINE HCL (CARDIAC) PF 100 MG/5ML IV SOSY
PREFILLED_SYRINGE | INTRAVENOUS | Status: DC | PRN
  Administered 2024-08-02 (×2): 100 mg via INTRAVENOUS

## 2024-08-02 NOTE — Interval H&P Note (Signed)
 History and Physical Interval Note:  08/02/2024 10:35 AM  Colton Perez  has presented today for surgery, with the diagnosis of Esophageal dysphagia [R13.19]   Gastroesophageal reflux disease, unspecified whether esophagitis present [K21.9].  The various methods of treatment have been discussed with the patient and family. After consideration of risks, benefits and other options for treatment, the patient has consented to  Procedure(s) with comments: EGD (ESOPHAGOGASTRODUODENOSCOPY) (N/A) - NEEDS TO GET CHILD ON SCHOOL BUS as a surgical intervention.  The patient's history has been reviewed, patient examined, no change in status, stable for surgery.  I have reviewed the patient's chart and labs.  Questions were answered to the patient's satisfaction.     Perez, Colton Takayama

## 2024-08-02 NOTE — Transfer of Care (Signed)
 Immediate Anesthesia Transfer of Care Note  Patient: Colton Perez  Procedure(s) Performed: EGD (ESOPHAGOGASTRODUODENOSCOPY)  Patient Location: PACU  Anesthesia Type:General  Level of Consciousness: drowsy and patient cooperative  Airway & Oxygen Therapy: Patient Spontanous Breathing  Post-op Assessment: Report given to RN and Post -op Vital signs reviewed and stable  Post vital signs: stable  Last Vitals:  Vitals Value Taken Time  BP    Temp    Pulse    Resp    SpO2      Last Pain:  Vitals:   08/02/24 0939  TempSrc: Temporal  PainSc: 0-No pain         Complications: No notable events documented.

## 2024-08-02 NOTE — Anesthesia Preprocedure Evaluation (Signed)
 Anesthesia Evaluation  Patient identified by MRN, date of birth, ID band Patient awake    Reviewed: Allergy & Precautions, H&P , NPO status , Patient's Chart, lab work & pertinent test results, reviewed documented beta blocker date and time   Airway Mallampati: II   Neck ROM: full    Dental  (+) Poor Dentition   Pulmonary neg pulmonary ROS, Current Smoker   Pulmonary exam normal        Cardiovascular Exercise Tolerance: Good negative cardio ROS Normal cardiovascular exam Rhythm:regular Rate:Normal     Neuro/Psych Seizures -,   Neuromuscular disease  negative psych ROS   GI/Hepatic Neg liver ROS,GERD  Medicated,,  Endo/Other  negative endocrine ROS    Renal/GU negative Renal ROS  negative genitourinary   Musculoskeletal   Abdominal   Peds  Hematology  (+) Blood dyscrasia, anemia   Anesthesia Other Findings Past Medical History: No date: Allergy No date: Anemia No date: Blood dyscrasia No date: Chicken pox No date: Colon polyp No date: Cytopenia     Comment:  PSEUDOCYTOPENIA-    LOW   PLATELETS,  H+H   08/01/2020: Dysplastic nevus     Comment:  L back left of midline - mod No date: GERD (gastroesophageal reflux disease) No date: Seizures (HCC) No date: Spherocytosis Past Surgical History: 07/28/2018: CHOLECYSTECTOMY; N/A     Comment:  Procedure: LAPAROSCOPIC CHOLECYSTECTOMY WITH               INTRAOPERATIVE CHOLANGIOGRAM ERAS PATHWAY;  Surgeon:               Belinda Cough, MD;  Location: [facility] OR;  Service: General;               Laterality: N/A; 10/10/2021: COLONOSCOPY WITH PROPOFOL ; N/A     Comment:  Procedure: COLONOSCOPY WITH PROPOFOL ;  Surgeon:               Maryruth Ole DASEN, MD;  Location: [facility] ENDOSCOPY;                Service: Endoscopy;  Laterality: N/A;  REQUESTS NO               STUDENT CRNA 07/28/2018: CYST EXCISION PERINEAL; N/A     Comment:  Procedure: CYST EXCISION PERINEAL;  Surgeon: Belinda Cough, MD;  Location: [facility] OR;  Service: General;                Laterality: N/A; 1996: PILONIDAL CYST EXCISION 1978: TESTICULAR EXPLORATION No date: WISDOM TOOTH EXTRACTION BMI    Body Mass Index: 37.53 kg/m     Reproductive/Obstetrics negative OB ROS                              Anesthesia Physical Anesthesia Plan  ASA: 2  Anesthesia Plan: General   Post-op Pain Management:    Induction:   PONV Risk Score and Plan:   Airway Management Planned:   Additional Equipment:   Intra-op Plan:   Post-operative Plan:   Informed Consent: I have reviewed the patients History and Physical, chart, labs and discussed the procedure including the risks, benefits and alternatives for the proposed anesthesia with the patient or authorized representative who has indicated his/her understanding and acceptance.     Dental Advisory Given  Plan Discussed with: CRNA  Anesthesia Plan Comments:  Anesthesia Quick Evaluation

## 2024-08-02 NOTE — H&P (Signed)
 Outpatient short stay form Pre-procedure 08/02/2024 10:34 AM Ellise Kovack K. Aundria, M.D.  Primary Physician: Leron Glance, NP  Reason for visit:  Gastroesophageal reflux disease, unspecified whether esophagitis present  Esophageal dysphagia     History of present illness:  Colton Perez is a 53 y.o. male who presents for New Patient, Gastroesophageal Reflux, and Dysphagia HPI History of Present Illness Colton Perez is a 53 year old male with persistent GERD who presents with symptoms of dysphagia.  Dysphagia - Difficulty swallowing, particularly with solid foods such as [place] breast - Sensation of food getting stuck in the mid-chest region - Symptoms present since at least July 2025  Gastroesophageal reflux disease (gerd) symptoms - Persistent reflux symptoms despite medication for 3-4 years - Initially treated with esomeprazole  for four years with good effect until recently - Currently taking famotidine , which provides some relief - Reflux symptoms are more pronounced after eating and when lying down, especially after dinner (largest meal of the day) - Typically eats dinner around 5:30 PM and occasionally has a small snack every other night - Rarely experiences reflux symptoms during the day unless consuming acidic foods such as tomato sauce - No abdominal pain, nausea, vomiting, or regurgitation during the day - No episodes of hematemesis  Voice changes - Scratchy, hoarse voice  Tobacco use - History of smoking for 35 years  Recent imaging - Recent chest x-ray showed an indistinct hylar region, otherwise unremarkable  Colonoscopy history - Colonoscopy in 2022 revealed benign hyperplastic polyps  Resolved dermatologic condition - History of a skin condition, possibly psoriasis or eczema, on the hands - Condition resolved and has not recurred in over ten years     Current Facility-Administered Medications:    0.9 %  sodium chloride  infusion, , Intravenous, Continuous, Crew Goren,  Valyn Latchford K, MD, Last Rate: 20 mL/hr at 08/02/24 0951, 20 mL/hr at 08/02/24 0951  Medications Prior to Admission  Medication Sig Dispense Refill Last Dose/Taking   esomeprazole  (NEXIUM ) 40 MG capsule Take 1 capsule (40 mg total) by mouth daily as needed. 90 capsule 3 08/01/2024   famotidine  (PEPCID ) 20 MG tablet Take 1 tablet (20 mg total) by mouth 2 (two) times daily. 30 tablet 1 08/01/2024   lamoTRIgine  (LAMICTAL  XR) 100 MG 24 hour tablet Take 1 tablet (100 mg total) by mouth daily along with 200 mg XR tab to equal 300 mg daily. 90 tablet 3 08/02/2024 at  7:30 AM   lamoTRIgine  (LAMICTAL  XR) 50 MG 24 hour tablet Take 1 tablet (50 mg total) by mouth daily along with 300 mg lamictal  to total 350 mg XR daily 90 tablet 3 08/01/2024   LamoTRIgine  200 MG TB24 24 hour tablet Take 1 tablet (200 mg total) by mouth daily along with 100 mg tab to equal 300 mg daily 90 tablet 3 08/01/2024     Allergies  Allergen Reactions   Aspirin Swelling    SWELLING REACTION UNSPECIFIED  SWELLING REACTION UNSPECIFIED     Like analgesic, salicylates group     Past Medical History:  Diagnosis Date   Allergy    Anemia    Blood dyscrasia    Chicken pox    Colon polyp    Cytopenia    PSEUDOCYTOPENIA-    LOW   PLATELETS,  H+H     Dysplastic nevus 08/01/2020   L back left of midline - mod   GERD (gastroesophageal reflux disease)    Seizures (HCC)    Spherocytosis     Review of systems:  Otherwise negative.    Physical Exam  Gen: Alert, oriented. Appears stated age.  HEENT: [place]/AT. PERRLA. Lungs: CTA, no wheezes. CV: RR nl S1, S2. Abd: soft, benign, no masses. BS+ Ext: No edema. Pulses 2+    Planned procedures: Proceed with colonoscopy. The patient understands the nature of the planned procedure, indications, risks, alternatives and potential complications including but not limited to bleeding, infection, perforation, damage to internal organs and possible oversedation/side effects from anesthesia. The  patient agrees and gives consent to proceed.  Please refer to procedure notes for findings, recommendations and patient disposition/instructions.     Dent Plantz K. Aundria, M.D. Gastroenterology 08/02/2024  10:34 AM

## 2024-08-02 NOTE — Op Note (Signed)
 [facility] Gastroenterology Patient Name: Colton Perez Procedure Date: 08/02/2024 11:01 AM MRN: [id number] Account #: [id number] Date of Birth: 1971-03-28 Admit Type: Outpatient Age: 53 Room: [facility] ENDO ROOM 2 Gender: Male Note Status: Finalized Instrument Name: Upper GI Scope [id number] Procedure:             Upper GI endoscopy Indications:           Dysphagia, Heartburn, Suspected gastro-esophageal                         reflux disease Providers:             Yianna Tersigni K. Aundria MD, MD Referring MD:          Leron Glance (Referring MD) Medicines:             Propofol  per Anesthesia Complications:         No immediate complications. Estimated blood loss:                         Minimal. Procedure:             Pre-Anesthesia Assessment:                        - The risks and benefits of the procedure and the                         sedation options and risks were discussed with the                         patient. All questions were answered and informed                         consent was obtained.                        - Patient identification and proposed procedure were                         verified prior to the procedure by the nurse. The                         procedure was verified in the procedure room.                        - ASA Grade Assessment: III - A patient with severe                         systemic disease.                        - After reviewing the risks and benefits, the patient                         was deemed in satisfactory condition to undergo the                         procedure.                        After obtaining informed consent, the endoscope was  passed under direct vision. Throughout the procedure,                         the patient's blood pressure, pulse, and oxygen                         saturations were monitored continuously. The Endoscope                         was introduced through the  mouth, and advanced to the                         third part of duodenum. The upper GI endoscopy was                         accomplished without difficulty. The patient tolerated                         the procedure well. Findings:      The examined duodenum was normal.      The stomach was normal.      Normal mucosa was found in the entire esophagus. Biopsies were obtained       from the proximal and distal esophagus with cold forceps for histology       of suspected eosinophilic esophagitis. Estimated blood loss was minimal.      No endoscopic abnormality was evident in the esophagus to explain the       patient's complaint of dysphagia. It was decided, however, to proceed       with dilation in the distal esophagus. The scope was withdrawn. Dilation       was performed with a Maloney dilator with no resistance at 54 Fr.       Estimated blood loss: none. Impression:            - Normal examined duodenum.                        - Normal stomach.                        - Normal mucosa was found in the entire esophagus.                        - No endoscopic esophageal abnormality to explain                         patient's dysphagia. Esophagus dilated. Dilated.                        - Biopsies were taken with a cold forceps for                         evaluation of eosinophilic esophagitis.                        - Procedure were lost due to computer malfunction. Recommendation:        - Patient has a contact number available for  emergencies. The signs and symptoms of potential                         delayed complications were discussed with the patient.                         Return to normal activities tomorrow. Written                         discharge instructions were provided to the patient.                        - Resume previous diet.                        - Continue present medications.                        - Use Protonix  (pantoprazole ) 40 mg  PO daily.                        - Await pathology results.                        - Return to my office in 4 months.                        - The findings and recommendations were discussed with                         the patient. Procedure Code(s):     --- Professional ---                        [phone], Esophagogastroduodenoscopy, flexible,                         transoral; with biopsy, single or multiple                        43450, Dilation of esophagus, by unguided sound or                         bougie, single or multiple passes Diagnosis Code(s):     --- Professional ---                        R12, Heartburn                        R13.10, Dysphagia, unspecified CPT copyright 2022 American Medical Association. All rights reserved. The codes documented in this report are preliminary and upon coder review may  be revised to meet current compliance requirements. Ladell MARLA Boss MD, MD 08/02/2024 11:24:58 AM This report has been signed electronically. Number of Addenda: 0 Note Initiated On: 08/02/2024 11:01 AM Estimated Blood Loss:  Estimated blood loss was minimal.      [facility]

## 2024-08-03 ENCOUNTER — Other Ambulatory Visit: Payer: Self-pay

## 2024-08-03 LAB — SURGICAL PATHOLOGY

## 2024-08-04 ENCOUNTER — Other Ambulatory Visit: Payer: Self-pay

## 2024-08-04 MED ORDER — PANTOPRAZOLE SODIUM 40 MG PO TBEC
40.0000 mg | DELAYED_RELEASE_TABLET | Freq: Every day | ORAL | 5 refills | Status: AC
  Filled 2024-08-04: qty 30, 30d supply, fill #0
  Filled 2024-09-18: qty 30, 30d supply, fill #1

## 2024-08-06 ENCOUNTER — Encounter: Payer: Self-pay | Admitting: Nurse Practitioner

## 2024-08-07 NOTE — Anesthesia Postprocedure Evaluation (Signed)
 Anesthesia Post Note  Patient: Colton Perez  Procedure(s) Performed: EGD (ESOPHAGOGASTRODUODENOSCOPY)  Patient location during evaluation: PACU Anesthesia Type: General Level of consciousness: awake and alert Pain management: pain level controlled Vital Signs Assessment: post-procedure vital signs reviewed and stable Respiratory status: spontaneous breathing, nonlabored ventilation, respiratory function stable and patient connected to nasal cannula oxygen Cardiovascular status: blood pressure returned to baseline and stable Postop Assessment: no apparent nausea or vomiting Anesthetic complications: no   There were no known notable events for this encounter.   Last Vitals:  Vitals:   08/02/24 1125 08/02/24 1135  BP: 116/68 (!) 107/55  Pulse: (!) 56   Resp: 16 12  Temp:    SpO2: 97% 97%    Last Pain:  Vitals:   08/02/24 1135  TempSrc:   PainSc: 0-No pain                 [name]

## 2024-08-07 NOTE — Telephone Encounter (Signed)
 Pt referral has not been sent due to needing office notes the provider has not completed them.

## 2024-08-08 DIAGNOSIS — K219 Gastro-esophageal reflux disease without esophagitis: Secondary | ICD-10-CM | POA: Insufficient documentation

## 2024-08-08 NOTE — Assessment & Plan Note (Signed)
 GERD with throat pressure post-eating, managed with Nexium . Possible reflux contribution to symptoms. - Prescribe famotidine  for additional acid suppression. - Advise to continue Nexium  40 mg once daily.

## 2024-08-08 NOTE — Assessment & Plan Note (Signed)
 Dysphagia with throat pressure and hoarseness for 2-3 weeks, worsening. Previous CT chest unremarkable. ENT noted irritation and redness.  - Order swallow study at [facility]. - Refer to ENT for further evaluation. - Will try to expedite appointment with GI at [facility]. - Advise ED visit if symptoms worsen, especially with breathing or swallowing difficulty.

## 2024-08-09 ENCOUNTER — Other Ambulatory Visit: Payer: Self-pay

## 2024-08-16 ENCOUNTER — Other Ambulatory Visit: Payer: Self-pay

## 2024-08-17 ENCOUNTER — Other Ambulatory Visit: Payer: Self-pay

## 2024-08-17 MED ORDER — LAMOTRIGINE ER 50 MG PO TB24
50.0000 mg | ORAL_TABLET | Freq: Every day | ORAL | 1 refills | Status: AC
  Filled 2024-08-17: qty 30, 30d supply, fill #0

## 2024-08-17 MED ORDER — LAMOTRIGINE ER 100 MG PO TB24
100.0000 mg | ORAL_TABLET | Freq: Every day | ORAL | 1 refills | Status: AC
  Filled 2024-08-17: qty 30, 30d supply, fill #0

## 2024-08-17 MED ORDER — LAMOTRIGINE ER 200 MG PO TB24
200.0000 mg | ORAL_TABLET | Freq: Every day | ORAL | 1 refills | Status: AC
  Filled 2024-08-17: qty 30, 30d supply, fill #0

## 2024-09-01 DIAGNOSIS — R490 Dysphonia: Secondary | ICD-10-CM | POA: Diagnosis not present

## 2024-09-01 DIAGNOSIS — K219 Gastro-esophageal reflux disease without esophagitis: Secondary | ICD-10-CM | POA: Diagnosis not present

## 2024-09-12 ENCOUNTER — Ambulatory Visit: Payer: Commercial Managed Care - PPO | Admitting: Dermatology

## 2024-09-14 ENCOUNTER — Ambulatory Visit: Admitting: Dermatology

## 2024-09-20 ENCOUNTER — Emergency Department
Admission: EM | Admit: 2024-09-20 | Discharge: 2024-09-20 | Disposition: A | Discharge status: Home / Self Care | Attending: Emergency Medicine | Admitting: Emergency Medicine

## 2024-09-20 ENCOUNTER — Other Ambulatory Visit: Payer: Self-pay

## 2024-09-20 ENCOUNTER — Emergency Department

## 2024-09-20 ENCOUNTER — Encounter: Payer: Self-pay | Admitting: Emergency Medicine

## 2024-09-20 ENCOUNTER — Other Ambulatory Visit: Payer: Self-pay | Admitting: *Deleted

## 2024-09-20 DIAGNOSIS — N281 Cyst of kidney, acquired: Secondary | ICD-10-CM | POA: Diagnosis not present

## 2024-09-20 DIAGNOSIS — K4090 Unilateral inguinal hernia, without obstruction or gangrene, not specified as recurrent: Secondary | ICD-10-CM | POA: Diagnosis not present

## 2024-09-20 DIAGNOSIS — D580 Hereditary spherocytosis: Secondary | ICD-10-CM

## 2024-09-20 DIAGNOSIS — R109 Unspecified abdominal pain: Secondary | ICD-10-CM | POA: Diagnosis not present

## 2024-09-20 DIAGNOSIS — R10A1 Flank pain, right side: Secondary | ICD-10-CM | POA: Diagnosis not present

## 2024-09-20 DIAGNOSIS — K429 Umbilical hernia without obstruction or gangrene: Secondary | ICD-10-CM | POA: Diagnosis not present

## 2024-09-20 LAB — CBC
HCT: 30.6 % — ABNORMAL LOW (ref 39.0–52.0)
Hemoglobin: 10.5 g/dL — ABNORMAL LOW (ref 13.0–17.0)
MCH: 32.4 pg (ref 26.0–34.0)
MCHC: 34.3 g/dL (ref 30.0–36.0)
MCV: 94.4 fL (ref 80.0–100.0)
Platelets: 140 K/uL — ABNORMAL LOW (ref 150–400)
RBC: 3.24 MIL/uL — ABNORMAL LOW (ref 4.22–5.81)
RDW: 18.5 % — ABNORMAL HIGH (ref 11.5–15.5)
WBC: 8.3 K/uL (ref 4.0–10.5)
nRBC: 0.2 % (ref 0.0–0.2)

## 2024-09-20 LAB — URINALYSIS, ROUTINE W REFLEX MICROSCOPIC
Bacteria, UA: NONE SEEN
Bilirubin Urine: NEGATIVE
Glucose, UA: NEGATIVE mg/dL
Hgb urine dipstick: NEGATIVE
Ketones, ur: NEGATIVE mg/dL
Leukocytes,Ua: NEGATIVE
Nitrite: NEGATIVE
Protein, ur: 30 mg/dL — AB
Specific Gravity, Urine: 1.032 — ABNORMAL HIGH (ref 1.005–1.030)
pH: 5 (ref 5.0–8.0)

## 2024-09-20 LAB — BASIC METABOLIC PANEL WITH GFR
Anion gap: 8 (ref 5–15)
BUN: 17 mg/dL (ref 6–20)
CO2: 27 mmol/L (ref 22–32)
Calcium: 9.2 mg/dL (ref 8.9–10.3)
Chloride: 105 mmol/L (ref 98–111)
Creatinine, Ser: 1.13 mg/dL (ref 0.61–1.24)
GFR, Estimated: >60 mL/min (ref >60)
Glucose, Bld: 115 mg/dL — ABNORMAL HIGH (ref 70–99)
Potassium: 5 mmol/L (ref 3.5–5.1)
Sodium: 140 mmol/L (ref 135–145)

## 2024-09-20 MED ORDER — KETOROLAC TROMETHAMINE 60 MG/2ML IM SOLN
60.0000 mg | Freq: Once | INTRAMUSCULAR | Status: AC
  Administered 2024-09-20: 60 mg via INTRAMUSCULAR
  Filled 2024-09-20: qty 2

## 2024-09-20 MED ORDER — KETOROLAC TROMETHAMINE 10 MG PO TABS
10.0000 mg | ORAL_TABLET | Freq: Four times a day (QID) | ORAL | 0 refills | Status: AC | PRN
  Filled 2024-09-20: qty 20, 5d supply, fill #0

## 2024-09-20 NOTE — ED Triage Notes (Signed)
 Patient to ED via POV for right sided flank pain with nausea. Started this AM. Denies urinary difficulties or V/D.

## 2024-09-20 NOTE — Discharge Instructions (Signed)
 As we discussed you should be receiving a phone call from Dr. Jerone office to arrange a follow-up appointment.  Please take your prescribed Toradol  as needed for discomfort as written.  Return to the emergency department for any significant worsening of pain or any other symptom personally concerning to yourself.

## 2024-09-20 NOTE — ED Provider Notes (Signed)
 [facility] Provider Note    Event Date/Time   First MD Initiated Contact with Patient 09/20/24 1117     (approximate)  History   Chief Complaint: Flank Pain  HPI  RAHIM ASTORGA is a 53 y.o. male with a past medical history of anemia, gastric reflux, seizure disorder, presents to the emergency department for right flank pain.  According to the patient since this morning he has been experiencing a sharp pain in his right flank/right back.  Patient has noted some darker urine over the last couple days as well.  No history of kidney stones.  Patient denies any vomiting although states some nausea.  No diarrhea.  No fever.  No dysuria.  Physical Exam   Triage Vital Signs: ED Triage Vitals [09/20/24 1035]  Encounter Vitals Group     BP 132/73     Girls Systolic BP Percentile      Girls Diastolic BP Percentile      Boys Systolic BP Percentile      Boys Diastolic BP Percentile      Pulse Rate 68     Resp 17     Temp 98.2 F (36.8 C)     Temp Source Oral     SpO2 98 %     Weight 240 lb (108.9 kg)     Height 5' 8 (1.727 m)     Head Circumference      Peak Flow      Pain Score 7     Pain Loc      Pain Education      Exclude from Growth Chart     Most recent vital signs: Vitals:   09/20/24 1035  BP: 132/73  Pulse: 68  Resp: 17  Temp: 98.2 F (36.8 C)  SpO2: 98%    General: Awake, no distress.  CV:  Good peripheral perfusion.  Regular rate and rhythm  Resp:  Normal effort.  Equal breath sounds bilaterally.  Abd:  No distention.  Soft, minimal right CVA tenderness palpation otherwise benign abdomen.  ED Results / Procedures / Treatments   RADIOLOGY  I have reviewed interpreted CT images.  No obvious kidney stone seen on my evaluation. 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   MEDICATIONS ORDERED IN ED: Medications - No data to display   IMPRESSION / MDM / ASSESSMENT  AND PLAN / ED COURSE  I reviewed the triage vital signs and the nursing notes.  Patient's presentation is most consistent with acute presentation with potential threat to life or bodily function.  Patient presents emergency department for right flank pain that started last night.  Patient describes the pain as a sharp pain he has mild right CVA tenderness and has noted some darker urine.  Patient's lab work today shows a reassuring CBC with a normal white blood cell count.  Patient's chemistry reassuring with normal creatinine.  Will obtain a CT renal scan as well as a urinalysis.  Will continue to closely monitor.  Differential would include ureterolithiasis, pyelonephritis, UTI, among other intra-abdominal pathology.  Patient's lab work is reassuring with a reassuring CBC reassuring chemistry and a normal urinalysis no red cells seen.  CT scan is negative for stone but does show several enlarged lymph nodes within enlarged spleen.  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  I spoke to Dr. Jacobo of oncology  who agrees that the spleen is likely related to the spherocytosis however the lymph nodes would not be related and recommends outpatient follow-up for further workup.  Patient agreeable to plan of care and will follow-up with oncology.  Will discharge with short course of Toradol  for pain control if needed.  Patient agreeable to plan.  FINAL CLINICAL IMPRESSION(S) / ED DIAGNOSES   Right flank pain   Note:  This document was prepared using Dragon voice recognition software and may include unintentional dictation errors.   Dorothyann Drivers, MD 09/20/24 1420

## 2024-09-22 ENCOUNTER — Encounter: Payer: Self-pay | Admitting: Oncology

## 2024-09-25 ENCOUNTER — Inpatient Hospital Stay: Attending: Oncology

## 2024-09-25 ENCOUNTER — Encounter: Payer: Self-pay | Admitting: Oncology

## 2024-09-25 ENCOUNTER — Inpatient Hospital Stay (HOSPITAL_BASED_OUTPATIENT_CLINIC_OR_DEPARTMENT_OTHER): Admitting: Oncology

## 2024-09-25 VITALS — BP 122/70 | HR 60 | Temp 97.4°F | Resp 18 | Ht 68.0 in | Wt 241.0 lb

## 2024-09-25 DIAGNOSIS — D649 Anemia, unspecified: Secondary | ICD-10-CM | POA: Insufficient documentation

## 2024-09-25 DIAGNOSIS — D696 Thrombocytopenia, unspecified: Secondary | ICD-10-CM | POA: Diagnosis not present

## 2024-09-25 DIAGNOSIS — F17210 Nicotine dependence, cigarettes, uncomplicated: Secondary | ICD-10-CM | POA: Diagnosis not present

## 2024-09-25 DIAGNOSIS — R10A0 Flank pain, unspecified side: Secondary | ICD-10-CM | POA: Diagnosis not present

## 2024-09-25 DIAGNOSIS — D580 Hereditary spherocytosis: Secondary | ICD-10-CM

## 2024-09-25 DIAGNOSIS — R161 Splenomegaly, not elsewhere classified: Secondary | ICD-10-CM | POA: Insufficient documentation

## 2024-09-25 LAB — CMP (CANCER CENTER ONLY)
ALT: 20 U/L (ref 0–44)
AST: 19 U/L (ref 15–41)
Albumin: 4.1 g/dL (ref 3.5–5.0)
Alkaline Phosphatase: 57 U/L (ref 38–126)
Anion gap: 7 (ref 5–15)
BUN: 18 mg/dL (ref 6–20)
CO2: 26 mmol/L (ref 22–32)
Calcium: 9.2 mg/dL (ref 8.9–10.3)
Chloride: 106 mmol/L (ref 98–111)
Creatinine: 0.88 mg/dL (ref 0.61–1.24)
GFR, Estimated: >60 mL/min (ref >60)
Glucose, Bld: 98 mg/dL (ref 70–99)
Potassium: 4.1 mmol/L (ref 3.5–5.1)
Sodium: 139 mmol/L (ref 135–145)
Total Bilirubin: 2.4 mg/dL — ABNORMAL HIGH (ref 0.0–1.2)
Total Protein: 6.9 g/dL (ref 6.5–8.1)

## 2024-09-25 LAB — LACTATE DEHYDROGENASE: LDH: 145 U/L (ref 105–235)

## 2024-09-25 NOTE — Progress Notes (Signed)
 [facility]  [phone] [phone]  ID: Colton Perez OB: [birth year]  MR#: [id number]  RDW#:[id number]  Patient Care Team: Gretel App, NP as PCP - General (Nurse Practitioner)  CHIEF COMPLAINT: Spherocytosis, anemia and thrombocytopenia.  INTERVAL HISTORY: Patient was last seen in clinic in July 2024.  He was recently evaluated for right flank pain and referred back for further evaluation.  He continues to have pain, but otherwise feels well.  He has no neurologic complaints.  He denies any weakness or fatigue.  He has a good appetite and denies weight loss.  He has no chest pain, shortness of breath, cough, or hemoptysis.  He denies any nausea, vomiting, constipation or diarrhea.  He has no melena or hematochezia.  He has no urinary complaints.  Patient offers no further specific complaints today.  REVIEW OF SYSTEMS:   Review of Systems  Constitutional: Negative.  Negative for fever, malaise/fatigue and weight loss.  Respiratory: Negative.  Negative for cough, hemoptysis and shortness of breath.   Cardiovascular: Negative.  Negative for chest pain and leg swelling.  Gastrointestinal: Negative.  Negative for abdominal pain, blood in stool and melena.  Genitourinary: Negative.  Negative for hematuria.  Musculoskeletal: Negative.   Skin: Negative.  Negative for rash.  Neurological: Negative.  Negative for dizziness, focal weakness, weakness and headaches.  Psychiatric/Behavioral: Negative.  The patient is not nervous/anxious.     As per HPI. Otherwise, a complete review of systems is negative.  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     Dysplastic nevus 08/01/2020   L back left of midline - mod   GERD (gastroesophageal reflux disease)    Seizures (HCC)    Spherocytosis     PAST  SURGICAL HISTORY: Past Surgical History:  Procedure Laterality Date   CHOLECYSTECTOMY N/A 07/28/2018   Procedure: LAPAROSCOPIC CHOLECYSTECTOMY WITH INTRAOPERATIVE CHOLANGIOGRAM ERAS PATHWAY;  Surgeon: Belinda Cough, MD;  Location: [facility] OR;  Service: General;  Laterality: N/A;   COLONOSCOPY WITH PROPOFOL  N/A 10/10/2021   Procedure: COLONOSCOPY WITH PROPOFOL ;  Surgeon: Maryruth Ole DASEN, MD;  Location: [facility] ENDOSCOPY;  Service: Endoscopy;  Laterality: N/A;  REQUESTS NO STUDENT CRNA   CYST EXCISION PERINEAL N/A 07/28/2018   Procedure: CYST EXCISION PERINEAL;  Surgeon: Belinda Cough, MD;  Location: [facility] OR;  Service: General;  Laterality: N/A;   ESOPHAGOGASTRODUODENOSCOPY N/A 08/02/2024   Procedure: EGD (ESOPHAGOGASTRODUODENOSCOPY);  Surgeon: Toledo, Ladell POUR, MD;  Location: [facility] ENDOSCOPY;  Service: Gastroenterology;  Laterality: N/A;  NEEDS TO GET CHILD ON SCHOOL BUS   PILONIDAL CYST EXCISION  1996   TESTICULAR EXPLORATION  1978   WISDOM TOOTH EXTRACTION      FAMILY HISTORY: Family History  Problem Relation Age of Onset   Hypertension Mother    Hyperlipidemia Mother    Asthma Mother    Depression Mother    Obesity Mother    Cancer Maternal Grandmother        pancreatic   Asthma Maternal Grandmother    Cancer Maternal Grandfather    Obesity Maternal Grandfather    Cancer Paternal Grandmother    COPD Paternal Grandmother    Cancer Paternal Uncle    Diabetes Maternal Uncle    Obesity Maternal Uncle    Obesity Maternal Uncle  ADVANCED DIRECTIVES (Y/N):  N  HEALTH MAINTENANCE: Social History   Tobacco Use   Smoking status: Every Day    Current packs/day: 1.00    Average packs/day: 1 pack/day for 35.0 years (35.0 ttl pk-yrs)    Types: Cigarettes   Smokeless tobacco: Never  Vaping Use   Vaping status: Never Used  Substance Use Topics   Alcohol use: Yes    Alcohol/week: 1.0 standard drink of alcohol    Types: 1 Shots of liquor per week    Comment: rare   Drug use: Never      Colonoscopy:  PAP:  Bone density:  Lipid panel:  Allergies  Allergen Reactions   Aspirin Swelling    SWELLING REACTION UNSPECIFIED  SWELLING REACTION UNSPECIFIED     Like analgesic, salicylates group    Current Outpatient Medications  Medication Sig Dispense Refill   ketorolac  (TORADOL ) 10 MG tablet Take 1 tablet (10 mg total) by mouth every 6 (six) hours as needed. 20 tablet 0   lamoTRIgine  (LAMICTAL  XR) 100 MG 24 hour tablet Take 1 tablet (100 mg) by mouth once a day along with 200 mg XR tab to equal 300 mg daily. 30 tablet 1   lamoTRIgine  (LAMICTAL  XR) 50 MG 24 hour tablet Take 1 tablet (50 mg) along with 300 mg lamictal  to total 350 mg XR daily 30 tablet 1   LamoTRIgine  200 MG TB24 24 hour tablet Take 1 tablet (200 mg) along with 100 mg tab to equal 300 mg daily/ 30 tablet 1   pantoprazole  (PROTONIX ) 40 MG tablet Take 1 tablet (40 mg total) by mouth daily before breakfast. 30 tablet 5   No current facility-administered medications for this visit.    OBJECTIVE: Vitals:   09/25/24 0934  BP: 122/70  Pulse: 60  Resp: 18  Temp: (!) 97.4 F (36.3 C)  SpO2: 99%      Body mass index is 36.64 kg/m.    ECOG FS:0 - Asymptomatic  General: Well-developed, well-nourished, no acute distress. Eyes: Pink conjunctiva, anicteric sclera. HEENT: Normocephalic, moist mucous membranes. Lungs: No audible wheezing or coughing. Heart: Regular rate and rhythm. Abdomen: Soft, nontender, no obvious distention. Musculoskeletal: No edema, cyanosis, or clubbing. Neuro: Alert, answering all questions appropriately. Cranial nerves grossly intact. Skin: No rashes or petechiae noted. Psych: Normal affect.  LAB RESULTS:  Lab Results  Component Value Date   NA 139 09/25/2024   K 4.1 09/25/2024   CL 106 09/25/2024   CO2 26 09/25/2024   GLUCOSE 98 09/25/2024   BUN 18 09/25/2024   CREATININE 0.88 09/25/2024   CALCIUM 9.2 09/25/2024   PROT 6.9 09/25/2024   ALBUMIN 4.1 09/25/2024    AST 19 09/25/2024   ALT 20 09/25/2024   ALKPHOS 57 09/25/2024   BILITOT 2.4 (H) 09/25/2024   GFRNONAA >60 09/25/2024   GFRAA >60 07/20/2018    Lab Results  Component Value Date   WBC 8.3 09/20/2024   NEUTROABS 5.3 04/26/2024   HGB 10.5 (L) 09/20/2024   HCT 30.6 (L) 09/20/2024   MCV 94.4 09/20/2024   PLT 140 (L) 09/20/2024   Lab Results  Component Value Date   IRON 136 04/26/2024   TIBC 277.2 04/26/2024   IRONPCTSAT 49.1 04/26/2024   Lab Results  Component Value Date   FERRITIN 237.3 04/26/2024     STUDIES: CT Renal Stone Study Result Date: 09/20/2024 CLINICAL DATA:  Abdominopelvic pain, stone suspected on the right. EXAM: CT ABDOMEN AND PELVIS WITHOUT CONTRAST TECHNIQUE: Multidetector CT imaging  of the abdomen and pelvis was performed following the standard protocol without IV contrast. RADIATION DOSE REDUCTION: This exam was performed according to the departmental dose-optimization program which includes automated exposure control, adjustment of the mA and/or kV according to patient size and/or use of iterative reconstruction technique. COMPARISON:  Ultrasound abdomen Apr 01, 2022. Chest CT June 21, 2024. FINDINGS: Lower chest: Ground-glass attenuation in bilateral lung base, left-greater-than-right, new to prior. Hepatobiliary: No focal liver abnormality is seen. Mild hepatic steatosis. Limited evaluation of focal lesions given the lack of contrast. Cholecystectomy. No biliary dilatation. Pancreas: Unremarkable. No pancreatic ductal dilatation or surrounding inflammatory changes. Spleen: Severe splenomegaly measuring 21.2 cm in length. Adrenals/Urinary Tract: Adrenal glands are unremarkable. Right kidney cortical cyst measuring 8 mm which does not require imaging follow-up. Kidneys are normal, without renal calculi, focal lesion, or hydronephrosis. Bladder is unremarkable. Stomach/Bowel: Stomach is within normal limits. Appendix appears normal. No evidence of bowel wall  thickening, distention, or inflammatory changes. Vascular/Lymphatic: Mesenteric root fat stranding and few prominent mesenteric root up to 1.2 cm. No significant vascular findings are present. Reproductive: Prostate is unremarkable. Other: Small fat containing right inguinal hernia. No abdominopelvic ascites. Musculoskeletal: No acute or significant osseous findings. Small fat containing umbilical hernia with defect measuring 11 mm. IMPRESSION: Few mesenteric root prominent lymph nodes, with associated misty mesentery and severe splenomegaly. Recommend further assessment to exclude lymphoproliferative disease. No nephrolithiasis or hydronephrosis. Right kidney simple cortical cyst which does not require imaging follow-up. Bilateral ground-glass attenuation in lung base, left-greater-than-right, new to prior. Electronically Signed   By: Megan  Zare M.D.   On: 09/20/2024 13:38    ASSESSMENT: Spherocytosis, anemia and thrombocytopenia.  PLAN:    Anemia: Patient's hemoglobin is decreased from his baseline but stable at 10.5.  Historically his baseline hemoglobin is between 11.0 and 11.5.  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  LDH is within normal limits today.  His total bilirubin remains chronically elevated at 2.4 which again is approximately his baseline.  Iron stores are within normal limits.  No intervention is needed at this time.  No further follow-up has been scheduled. Thrombocytopenia: Chronic and unchanged.  Secondary splenomegaly.  Patient's platelet count has ranged between 118 and 145 Since at least April 2015.  His ALT is 140.  No intervention needed. 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  No intervention needed.  Patient likely has a low level of hemolysis and a baseline anemia secondary to this diagnosis. Splenomegaly: CT scan results from September 20, 2024 revealed spleen size of 21.2 which is essentially stable from previous imaging.  No  intervention is needed. Flank pain: No obvious etiology on imaging or laboratory work.  Continue to symptomatically treat and follow-up with primary care. Prominent lymph nodes: Likely reactive.  Can consider repeat imaging in 6 months if clinically appropriate.  No follow-up has been scheduled as above.   Patient expressed understanding and was in agreement with this plan. He also understands that He can call clinic at any time with any questions, concerns, or complaints.    Evalene JINNY Reusing, MD   09/25/2024 9:54 AM

## 2024-09-25 NOTE — Progress Notes (Signed)
 Patient is having some flank pain, which he took his toradol  and it is helping.

## 2024-09-26 ENCOUNTER — Encounter: Payer: Self-pay | Admitting: Dermatology

## 2024-09-26 ENCOUNTER — Ambulatory Visit: Admitting: Dermatology

## 2024-09-26 DIAGNOSIS — L578 Other skin changes due to chronic exposure to nonionizing radiation: Secondary | ICD-10-CM | POA: Diagnosis not present

## 2024-09-26 DIAGNOSIS — L814 Other melanin hyperpigmentation: Secondary | ICD-10-CM | POA: Diagnosis not present

## 2024-09-26 DIAGNOSIS — D229 Melanocytic nevi, unspecified: Secondary | ICD-10-CM

## 2024-09-26 DIAGNOSIS — L570 Actinic keratosis: Secondary | ICD-10-CM

## 2024-09-26 DIAGNOSIS — L821 Other seborrheic keratosis: Secondary | ICD-10-CM

## 2024-09-26 DIAGNOSIS — L820 Inflamed seborrheic keratosis: Secondary | ICD-10-CM

## 2024-09-26 DIAGNOSIS — Z1283 Encounter for screening for malignant neoplasm of skin: Secondary | ICD-10-CM | POA: Diagnosis not present

## 2024-09-26 DIAGNOSIS — I872 Venous insufficiency (chronic) (peripheral): Secondary | ICD-10-CM | POA: Diagnosis not present

## 2024-09-26 DIAGNOSIS — W908XXA Exposure to other nonionizing radiation, initial encounter: Secondary | ICD-10-CM | POA: Diagnosis not present

## 2024-09-26 DIAGNOSIS — D1801 Hemangioma of skin and subcutaneous tissue: Secondary | ICD-10-CM | POA: Diagnosis not present

## 2024-09-26 DIAGNOSIS — Z86018 Personal history of other benign neoplasm: Secondary | ICD-10-CM | POA: Diagnosis not present

## 2024-09-26 LAB — HAPTOGLOBIN: Haptoglobin: 29 mg/dL (ref 29–370)

## 2024-09-26 NOTE — Patient Instructions (Signed)

## 2024-09-26 NOTE — Progress Notes (Signed)
 Follow-Up Visit   Subjective  Colton Perez is a 53 y.o. male who presents for the following: Skin Cancer Screening and Full Body Skin Exam  The patient presents for Total-Body Skin Exam (TBSE) for skin cancer screening and mole check. The patient has spots, moles and lesions to be evaluated, some may be new or changing and the patient may have concern these could be cancer.  Hx DN, AK's. Patient with a spot at left forehead that he would like looked at, gets irritated.   The following portions of the chart were reviewed this encounter and updated as appropriate: medications, allergies, medical history  Review of Systems:  No other skin or systemic complaints except as noted in HPI or Assessment and Plan.  Objective  Well appearing patient in no apparent distress; mood and affect are within normal limits.  A full examination was performed including scalp, head, eyes, ears, nose, lips, neck, chest, axillae, abdomen, back, buttocks, bilateral upper extremities, bilateral lower extremities, hands, feet, fingers, toes, fingernails, and toenails. All findings within normal limits unless otherwise noted below.   Relevant physical exam findings are noted in the Assessment and Plan.  L zygoma x 1 Erythematous thin papules/macules with gritty scale.   Assessment & Plan   SKIN CANCER SCREENING PERFORMED TODAY.  ACTINIC DAMAGE - Chronic condition, secondary to cumulative UV/sun exposure - diffuse scaly erythematous macules with underlying dyspigmentation - Recommend daily broad spectrum sunscreen SPF 30+ to sun-exposed areas, reapply every 2 hours as needed.  - Staying in the shade or wearing long sleeves, sun glasses (UVA+UVB protection) and wide brim hats (4-inch brim around the entire circumference of the hat) are also recommended for sun protection.  - Call for new or changing lesions.  LENTIGINES, SEBORRHEIC KERATOSES, HEMANGIOMAS - Benign normal skin lesions - Benign-appearing -  Call for any changes  MELANOCYTIC NEVI - Tan-brown and/or pink-flesh-colored symmetric macules and papules - Benign appearing on exam today - Observation - Call clinic for new or changing moles - Recommend daily use of broad spectrum spf 30+ sunscreen to sun-exposed areas.   History of Dysplastic Nevi. Left back, left of midline. Moderate. 08/01/2020. - No evidence of recurrence today - Recommend regular full body skin exams - Recommend daily broad spectrum sunscreen SPF 30+ to sun-exposed areas, reapply every 2 hours as needed.  - Call if any new or changing lesions are noted between office visits   STASIS DERMATITIS Exam: Erythematous to red-brown pinpoint macules coalescing to patches and peripheral edema of bilateral lower legs.  flared  Stasis in the legs causes chronic leg swelling, which may result in itchy or painful rashes, skin discoloration, skin texture changes, and sometimes ulceration.  Recommend daily graduated compression hose/stockings- easiest to put on first thing in morning, remove at bedtime.  Elevate legs as much as possible. Avoid salt/sodium rich foods.  Treatment Plan: Recommend compression socks daily   LICHENOID KERATOSIS left forehead Symptomatic, irritating, patient would like treated.  Benign-appearing.  Call clinic for new or changing lesions.   Destruction of lesion - left forehead Complexity: simple   Destruction method: cryotherapy   Informed consent: discussed and consent obtained   Timeout:  patient name, date of birth, surgical site, and procedure verified Lesion destroyed using liquid nitrogen: Yes   Region frozen until ice ball extended beyond lesion: Yes   Cryo cycles: 1 or 2. Outcome: patient tolerated procedure well with no complications   Post-procedure details: wound care instructions given    AK (  ACTINIC KERATOSIS) L zygoma x 1 Actinic keratoses are precancerous spots that appear secondary to cumulative UV radiation exposure/sun  exposure over time. They are chronic with expected duration over 1 year. A portion of actinic keratoses will progress to squamous cell carcinoma of the skin. It is not possible to reliably predict which spots will progress to skin cancer and so treatment is recommended to prevent development of skin cancer.  Recommend daily broad spectrum sunscreen SPF 30+ to sun-exposed areas, reapply every 2 hours as needed.  Recommend staying in the shade or wearing long sleeves, sun glasses (UVA+UVB protection) and wide brim hats (4-inch brim around the entire circumference of the hat). Call for new or changing lesions. Destruction of lesion - L zygoma x 1 Complexity: simple   Destruction method: cryotherapy   Informed consent: discussed and consent obtained   Timeout:  patient name, date of birth, surgical site, and procedure verified Lesion destroyed using liquid nitrogen: Yes   Region frozen until ice ball extended beyond lesion: Yes   Cryo cycles: 1 or 2. Outcome: patient tolerated procedure well with no complications   Post-procedure details: wound care instructions given    MULTIPLE BENIGN NEVI   LENTIGINES   ACTINIC ELASTOSIS   SEBORRHEIC KERATOSES   CHERRY ANGIOMA   VENOUS STASIS DERMATITIS OF BOTH LOWER EXTREMITIES   Return in about 1 year (around 09/26/2025) for TBSE, with Dr. Claudene, HxDN.  LILLETTE Colton Perez, RMA, am acting as scribe for Boneta Claudene, MD .   Documentation: I have reviewed the above documentation for accuracy and completeness, and I agree with the above.  Boneta Claudene, MD

## 2024-09-28 LAB — COMP PANEL: LEUKEMIA/LYMPHOMA

## 2024-10-17 ENCOUNTER — Other Ambulatory Visit: Payer: Self-pay

## 2024-10-17 DIAGNOSIS — R569 Unspecified convulsions: Secondary | ICD-10-CM | POA: Diagnosis not present

## 2024-10-17 DIAGNOSIS — M545 Low back pain, unspecified: Secondary | ICD-10-CM | POA: Diagnosis not present

## 2024-10-17 DIAGNOSIS — Z1331 Encounter for screening for depression: Secondary | ICD-10-CM | POA: Diagnosis not present

## 2024-10-17 DIAGNOSIS — G8929 Other chronic pain: Secondary | ICD-10-CM | POA: Diagnosis not present

## 2024-10-17 MED ORDER — LAMOTRIGINE ER 100 MG PO TB24
100.0000 mg | ORAL_TABLET | Freq: Every day | ORAL | 3 refills | Status: AC
  Filled 2024-10-17: qty 90, 90d supply, fill #0

## 2024-10-17 MED ORDER — LAMOTRIGINE ER 200 MG PO TB24
200.0000 mg | ORAL_TABLET | Freq: Once | ORAL | 3 refills | Status: AC
  Filled 2024-10-17: qty 60, 60d supply, fill #0
  Filled 2024-10-18: qty 30, 30d supply, fill #0

## 2024-10-17 MED ORDER — LAMOTRIGINE ER 50 MG PO TB24
50.0000 mg | ORAL_TABLET | ORAL | 3 refills | Status: AC
  Filled 2024-10-17: qty 90, 90d supply, fill #0

## 2024-10-18 ENCOUNTER — Other Ambulatory Visit: Payer: Self-pay

## 2025-05-02 ENCOUNTER — Encounter: Admitting: Nurse Practitioner

## 2025-09-27 ENCOUNTER — Ambulatory Visit: Admitting: Dermatology
